# Patient Record
Sex: Male | Born: 1950 | Race: White | Hispanic: No | Marital: Married | Smoking: Never smoker
Health system: Southern US, Community
[De-identification: ages and names within clinical notes are randomized; demographics above are authoritative.]

## PROBLEM LIST (undated history)

## (undated) DIAGNOSIS — K219 Gastro-esophageal reflux disease without esophagitis: Secondary | ICD-10-CM

## (undated) DIAGNOSIS — I2510 Atherosclerotic heart disease of native coronary artery without angina pectoris: Secondary | ICD-10-CM

## (undated) DIAGNOSIS — I219 Acute myocardial infarction, unspecified: Secondary | ICD-10-CM

## (undated) DIAGNOSIS — Z8719 Personal history of other diseases of the digestive system: Secondary | ICD-10-CM

## (undated) DIAGNOSIS — I10 Essential (primary) hypertension: Secondary | ICD-10-CM

## (undated) DIAGNOSIS — M1990 Unspecified osteoarthritis, unspecified site: Secondary | ICD-10-CM

## (undated) HISTORY — DX: Gastro-esophageal reflux disease without esophagitis: K21.9

## (undated) HISTORY — DX: Atherosclerotic heart disease of native coronary artery without angina pectoris: I25.10

## (undated) HISTORY — PX: TIBIA FRACTURE SURGERY: SHX806

## (undated) HISTORY — PX: TONSILLECTOMY: SUR1361

---

## 2008-09-11 DIAGNOSIS — I219 Acute myocardial infarction, unspecified: Secondary | ICD-10-CM

## 2008-09-11 HISTORY — PX: CORONARY ARTERY BYPASS GRAFT: SHX141

## 2008-09-11 HISTORY — DX: Acute myocardial infarction, unspecified: I21.9

## 2009-04-15 ENCOUNTER — Encounter (HOSPITAL_COMMUNITY): Admission: RE | Admit: 2009-04-15 | Discharge: 2009-06-10 | Payer: Self-pay | Admitting: Cardiovascular Disease

## 2010-01-15 ENCOUNTER — Emergency Department (HOSPITAL_COMMUNITY): Admission: EM | Admit: 2010-01-15 | Discharge: 2010-01-16 | Payer: Self-pay | Admitting: Emergency Medicine

## 2010-09-06 ENCOUNTER — Ambulatory Visit: Payer: Self-pay | Admitting: Cardiovascular Disease

## 2010-09-14 ENCOUNTER — Ambulatory Visit: Payer: Self-pay | Admitting: Cardiovascular Disease

## 2010-11-29 LAB — CBC
HCT: 44.9 % (ref 39.0–52.0)
Hemoglobin: 15.6 g/dL (ref 13.0–17.0)
MCHC: 34.6 g/dL (ref 30.0–36.0)
Platelets: 218 10*3/uL (ref 150–400)
RDW: 13.4 % (ref 11.5–15.5)

## 2010-11-29 LAB — LIPASE, BLOOD: Lipase: 45 U/L (ref 11–59)

## 2010-11-29 LAB — DIFFERENTIAL
Eosinophils Absolute: 0.3 10*3/uL (ref 0.0–0.7)
Eosinophils Relative: 3 % (ref 0–5)
Lymphocytes Relative: 28 % (ref 12–46)
Neutrophils Relative %: 58 % (ref 43–77)

## 2010-11-29 LAB — POCT CARDIAC MARKERS
CKMB, poc: <1 ng/mL — ABNORMAL LOW (ref 1.0–8.0)
CKMB, poc: <1 ng/mL — ABNORMAL LOW (ref 1.0–8.0)
Myoglobin, poc: 73 ng/mL (ref 12–200)
Myoglobin, poc: 76.5 ng/mL (ref 12–200)
Troponin i, poc: <0.05 ng/mL (ref 0.00–0.09)

## 2010-11-29 LAB — POCT I-STAT, CHEM 8
BUN: 23 mg/dL (ref 6–23)
HCT: 47 % (ref 39.0–52.0)

## 2010-12-20 ENCOUNTER — Telehealth: Payer: Self-pay | Admitting: Cardiovascular Disease

## 2010-12-20 ENCOUNTER — Other Ambulatory Visit (INDEPENDENT_AMBULATORY_CARE_PROVIDER_SITE_OTHER): Payer: BC Managed Care – PPO | Admitting: *Deleted

## 2010-12-20 DIAGNOSIS — E7800 Pure hypercholesterolemia, unspecified: Secondary | ICD-10-CM

## 2010-12-20 DIAGNOSIS — E785 Hyperlipidemia, unspecified: Secondary | ICD-10-CM

## 2010-12-20 LAB — HEPATIC FUNCTION PANEL
AST: 22 U/L (ref 0–37)
Bilirubin, Direct: 0.1 mg/dL (ref 0.0–0.3)
Total Bilirubin: 0.7 mg/dL (ref 0.3–1.2)

## 2010-12-20 LAB — LIPID PANEL
LDL Cholesterol: 52 mg/dL (ref 0–99)
Total CHOL/HDL Ratio: 3
Triglycerides: 78 mg/dL (ref 0.0–149.0)
VLDL: 15.6 mg/dL (ref 0.0–40.0)

## 2010-12-20 LAB — BASIC METABOLIC PANEL
Creatinine, Ser: 1.1 mg/dL (ref 0.4–1.5)
GFR: 75.61 mL/min (ref >60.00)
Potassium: 4.3 mEq/L (ref 3.5–5.1)
Sodium: 141 mEq/L (ref 135–145)

## 2010-12-20 NOTE — Telephone Encounter (Signed)
Walk in for fasting labs, order placed Alfonso Ramus RN

## 2010-12-20 NOTE — Telephone Encounter (Signed)
Patient called with lab results. Jodette Deakon Frix RN  

## 2010-12-21 ENCOUNTER — Telehealth: Payer: Self-pay | Admitting: Cardiovascular Disease

## 2010-12-21 NOTE — Telephone Encounter (Signed)
PT SAID SOMEONE LEFT LAB RESULTS BUT HE WANTS A CALL BACK FOR A NURSE TO GO OVER THOSE #'S WITH HIM. CHART IN BOX.

## 2010-12-21 NOTE — Telephone Encounter (Signed)
Results of cholesterol given, he wants to know if med needs adjusting or what he can do to increase his HDL of 34. Please advise. Alfonso Ramus RN

## 2010-12-22 ENCOUNTER — Telehealth: Payer: Self-pay | Admitting: Cardiovascular Disease

## 2010-12-22 ENCOUNTER — Other Ambulatory Visit: Payer: Self-pay | Admitting: *Deleted

## 2010-12-22 ENCOUNTER — Other Ambulatory Visit: Payer: Self-pay | Admitting: Cardiovascular Disease

## 2010-12-22 ENCOUNTER — Telehealth: Payer: Self-pay | Admitting: *Deleted

## 2010-12-22 DIAGNOSIS — E785 Hyperlipidemia, unspecified: Secondary | ICD-10-CM

## 2010-12-22 MED ORDER — NIACIN ER (ANTIHYPERLIPIDEMIC) 1000 MG PO TBCR: 1000.0000 mg | EXTENDED_RELEASE_TABLET | Freq: Every day | ORAL | Status: DC

## 2010-12-22 NOTE — Telephone Encounter (Signed)
Add niaspan 500 mg qhs.

## 2010-12-22 NOTE — Telephone Encounter (Signed)
Pt concerned with niaspan being increased to 1000mg . [place] Spaniel he has some joint aches and wonders if its from that drug, explained to him it is not a usual side effect for niaspan but i would ask you if he should increase it or not. Please advise. Darrell Ramus RN

## 2010-12-22 NOTE — Telephone Encounter (Signed)
msg left to increase med, refill with increased dose [facility].  Mailed results with request of lab app in 3 months to  Pt.Alfonso Ramus RN

## 2010-12-22 NOTE — Telephone Encounter (Signed)
Pt phoned msg left for an increase to niaspan to 1000mg  and to increase exercise. Office number left for any questions or concerns.Alfonso Ramus RN

## 2010-12-23 ENCOUNTER — Telehealth: Payer: Self-pay | Admitting: *Deleted

## 2010-12-23 NOTE — Telephone Encounter (Signed)
Niaspan will not cause muscle aches.

## 2010-12-23 NOTE — Telephone Encounter (Signed)
Pt called with the ok to increase niaspan, msg left.Alfonso Ramus RN

## 2010-12-26 ENCOUNTER — Other Ambulatory Visit: Payer: Self-pay | Admitting: *Deleted

## 2010-12-26 DIAGNOSIS — E785 Hyperlipidemia, unspecified: Secondary | ICD-10-CM

## 2010-12-26 MED ORDER — NIACIN ER (ANTIHYPERLIPIDEMIC) 1000 MG PO TBCR: 1000.0000 mg | EXTENDED_RELEASE_TABLET | Freq: Every day | ORAL | Status: DC

## 2011-01-04 ENCOUNTER — Telehealth: Payer: Self-pay | Admitting: *Deleted

## 2011-01-04 NOTE — Telephone Encounter (Signed)
Clarified fax from pharmacy, error send on there part.Alfonso Ramus RN

## 2011-03-22 ENCOUNTER — Other Ambulatory Visit: Payer: Self-pay | Admitting: *Deleted

## 2011-03-22 ENCOUNTER — Telehealth: Payer: Self-pay | Admitting: Cardiovascular Disease

## 2011-03-22 DIAGNOSIS — E785 Hyperlipidemia, unspecified: Secondary | ICD-10-CM

## 2011-03-22 NOTE — Telephone Encounter (Signed)
Pt told to call back and set lab app and if he wants to see dr Ian Bushman we can get him an app. Told to call back.

## 2011-03-22 NOTE — Telephone Encounter (Signed)
Patient called today wanting to schedule his appointment from his recall letter but there were no labs listed for him. Despite the advice given from you he really insists that he wants to have some blood work done a day ahead of schedule so he can go over the information with the doctor. Also says that he only has the summer months to come in anytime and any other time that Dr. Elease Hashimoto has available (starting on Aug 29th) does not work for him. Please call back. I have pulled his chart.

## 2011-03-23 NOTE — Telephone Encounter (Signed)
App set.

## 2011-03-30 ENCOUNTER — Other Ambulatory Visit (INDEPENDENT_AMBULATORY_CARE_PROVIDER_SITE_OTHER): Payer: BC Managed Care – PPO | Admitting: *Deleted

## 2011-03-30 DIAGNOSIS — E785 Hyperlipidemia, unspecified: Secondary | ICD-10-CM

## 2011-03-30 LAB — BASIC METABOLIC PANEL
BUN: 19 mg/dL (ref 6–23)
Chloride: 105 mEq/L (ref 96–112)
Creatinine, Ser: 1 mg/dL (ref 0.4–1.5)
Glucose, Bld: 101 mg/dL — ABNORMAL HIGH (ref 70–99)
Sodium: 141 mEq/L (ref 135–145)

## 2011-03-30 LAB — LIPID PANEL
Cholesterol: 116 mg/dL (ref 0–200)
LDL Cholesterol: 56 mg/dL (ref 0–99)
Total CHOL/HDL Ratio: 3
VLDL: 15.2 mg/dL (ref 0.0–40.0)

## 2011-03-30 LAB — HEPATIC FUNCTION PANEL
ALT: 29 U/L (ref 0–53)
Albumin: 4.4 g/dL (ref 3.5–5.2)
Total Bilirubin: 0.6 mg/dL (ref 0.3–1.2)
Total Protein: 7.5 g/dL (ref 6.0–8.3)

## 2011-03-31 ENCOUNTER — Telehealth: Payer: Self-pay | Admitting: *Deleted

## 2011-03-31 NOTE — Telephone Encounter (Signed)
Message copied by Antony Odea on Fri Mar 31, 2011  3:40 PM ------      Message from: Vesta Mixer      Created: Fri Mar 31, 2011 12:02 PM       Normal

## 2011-03-31 NOTE — Telephone Encounter (Signed)
Normal labs given in msg, Alfonso Ramus RN

## 2011-05-16 ENCOUNTER — Other Ambulatory Visit: Payer: Self-pay | Admitting: *Deleted

## 2011-05-16 MED ORDER — METOPROLOL TARTRATE 25 MG PO TABS: 25.0000 mg | ORAL_TABLET | Freq: Two times a day (BID) | ORAL | Status: DC

## 2011-05-16 MED ORDER — ROSUVASTATIN CALCIUM 10 MG PO TABS: 10.0000 mg | ORAL_TABLET | Freq: Every day | ORAL | Status: DC

## 2011-05-16 NOTE — Telephone Encounter (Signed)
Fax received from pharmacy. Refill completed. Jodette Doyce Stonehouse RN  

## 2011-09-01 ENCOUNTER — Other Ambulatory Visit: Payer: BC Managed Care – PPO | Admitting: *Deleted

## 2011-09-04 ENCOUNTER — Ambulatory Visit (INDEPENDENT_AMBULATORY_CARE_PROVIDER_SITE_OTHER): Payer: BC Managed Care – PPO | Admitting: *Deleted

## 2011-09-04 DIAGNOSIS — E7800 Pure hypercholesterolemia, unspecified: Secondary | ICD-10-CM

## 2011-09-04 LAB — HEPATIC FUNCTION PANEL
ALT: 21 U/L (ref 0–53)
AST: 22 U/L (ref 0–37)
Bilirubin, Direct: 0.1 mg/dL (ref 0.0–0.3)

## 2011-09-04 LAB — LIPID PANEL
HDL: 36.9 mg/dL — ABNORMAL LOW (ref >39.00)
Total CHOL/HDL Ratio: 3
Triglycerides: 67 mg/dL (ref 0.0–149.0)
VLDL: 13.4 mg/dL (ref 0.0–40.0)

## 2011-09-07 ENCOUNTER — Ambulatory Visit (INDEPENDENT_AMBULATORY_CARE_PROVIDER_SITE_OTHER): Payer: BC Managed Care – PPO | Admitting: Cardiovascular Disease

## 2011-09-07 ENCOUNTER — Encounter: Payer: Self-pay | Admitting: Cardiovascular Disease

## 2011-09-07 VITALS — BP 111/76 | HR 66 | Ht 70.0 in | Wt 164.8 lb

## 2011-09-07 DIAGNOSIS — I2510 Atherosclerotic heart disease of native coronary artery without angina pectoris: Secondary | ICD-10-CM

## 2011-09-07 DIAGNOSIS — E785 Hyperlipidemia, unspecified: Secondary | ICD-10-CM

## 2011-09-07 NOTE — Progress Notes (Signed)
    Darrell Wagner Date of Birth  [birth year] [facility] [address]    [address] [place], [place]  [address] [phone]  Fax  [phone]  [facility] [address] [place], [place]  [address] [phone]   Fax [phone]  History of Present Illness:  Darrell Wagner is a 60 year old gentleman with a history of coronary artery disease-status post CABG. He also had transient atrial fibrillation. [name] done well since I last saw him. [name] not having episodes of chest pain or shortness of breath.  Current Outpatient Prescriptions on File Prior to Visit  Medication Sig Dispense Refill  . metoprolol tartrate (LOPRESSOR) 25 MG tablet Take 1 tablet (25 mg total) by mouth 2 (two) times daily.  60 tablet  3  . niacin (NIASPAN) 1000 MG CR tablet Take 1 tablet (1,000 mg total) by mouth at bedtime.  90 tablet  3  . rosuvastatin (CRESTOR) 10 MG tablet Take 1 tablet (10 mg total) by mouth at bedtime.  30 tablet  3    Not on File  Past Medical History  Diagnosis Date  . Coronary artery disease     Past Surgical History  Procedure Date  . Coronary artery bypass graft     x4  . Tonsillectomy   . Tibia fracture surgery     History  Smoking status  . Never Smoker   Smokeless tobacco  . Not on file    History  Alcohol Use     Family History  Problem Relation Age of Onset  . Hypertension Mother   . Hypertension Sister     Reviw of Systems:  Reviewed in the HPI.  All other systems are negative.  Physical Exam: BP 111/76  Pulse 66  Ht 5\' 10"  (1.778 m)  Wt 164 lb 12.8 oz (74.753 kg)  BMI 23.65 kg/m2 The patient is alert and oriented x 3.  The mood and affect are normal.   Skin: warm and dry.  Color is normal.    HEENT:   Normocephalic/atraumatic. Normal carotids. His neck is supple.  Lungs: Lungs are clear to auscultation.   Heart: Regular rate S1-S2. No murmurs.    Abdomen: Good bowel sounds. No hepatosplenomegaly.  Extremities:  No clubbing cyanosis  or edema.  Neuro:  Nonfocal. His gait is normal.    ECG: Normal sinus rhythm, normal EKG.  Assessment / Plan:

## 2011-09-07 NOTE — Assessment & Plan Note (Signed)
Darrell Wagner is a history of CAD and is status post CABG in July of 2010. She's done well. We'll continue the same medications.  His lipid levels are well controlled. His HDL is still a bit low. I've encouraged him to exercise on regular basis.

## 2011-09-07 NOTE — Patient Instructions (Signed)
Your physician wants you to follow-up in: 6 months You will receive a reminder letter in the mail two months in advance. If you don't receive a letter, please call our office to schedule the follow-up appointment.  Your physician recommends that you return for a FASTING lipid profile: 6 months   Your physician recommends that you continue on your current medications as directed. Please refer to the Current Medication list given to you today.  

## 2011-09-15 ENCOUNTER — Other Ambulatory Visit: Payer: Self-pay | Admitting: *Deleted

## 2011-09-15 ENCOUNTER — Other Ambulatory Visit: Payer: Self-pay

## 2011-09-15 MED ORDER — ROSUVASTATIN CALCIUM 10 MG PO TABS: 10.0000 mg | ORAL_TABLET | Freq: Every day | ORAL | Status: DC

## 2011-09-15 NOTE — Telephone Encounter (Signed)
Fax Received. Refill Completed. Deverick Pruss Chowoe (R.M.A)   

## 2011-09-19 ENCOUNTER — Other Ambulatory Visit: Payer: Self-pay | Admitting: *Deleted

## 2011-09-19 MED ORDER — METOPROLOL TARTRATE 25 MG PO TABS: 25.0000 mg | ORAL_TABLET | Freq: Two times a day (BID) | ORAL | Status: DC

## 2012-01-15 ENCOUNTER — Other Ambulatory Visit: Payer: Self-pay | Admitting: *Deleted

## 2012-01-15 DIAGNOSIS — E785 Hyperlipidemia, unspecified: Secondary | ICD-10-CM

## 2012-01-15 MED ORDER — NIACIN ER (ANTIHYPERLIPIDEMIC) 1000 MG PO TBCR: 1000.0000 mg | EXTENDED_RELEASE_TABLET | Freq: Every day | ORAL | Status: DC

## 2012-01-22 ENCOUNTER — Other Ambulatory Visit: Payer: Self-pay | Admitting: *Deleted

## 2012-01-22 MED ORDER — METOPROLOL TARTRATE 25 MG PO TABS: 25.0000 mg | ORAL_TABLET | Freq: Two times a day (BID) | ORAL | Status: DC

## 2012-03-19 ENCOUNTER — Other Ambulatory Visit: Payer: Self-pay | Admitting: Cardiovascular Disease

## 2012-03-25 ENCOUNTER — Other Ambulatory Visit (INDEPENDENT_AMBULATORY_CARE_PROVIDER_SITE_OTHER): Payer: BC Managed Care – PPO

## 2012-03-25 DIAGNOSIS — E785 Hyperlipidemia, unspecified: Secondary | ICD-10-CM

## 2012-03-25 LAB — BASIC METABOLIC PANEL
CO2: 27 mEq/L (ref 19–32)
Chloride: 107 mEq/L (ref 96–112)
Glucose, Bld: 89 mg/dL (ref 70–99)
Potassium: 3.9 mEq/L (ref 3.5–5.1)
Sodium: 141 mEq/L (ref 135–145)

## 2012-03-25 LAB — HEPATIC FUNCTION PANEL
ALT: 19 U/L (ref 0–53)
Total Bilirubin: 0.5 mg/dL (ref 0.3–1.2)
Total Protein: 7.7 g/dL (ref 6.0–8.3)

## 2012-03-25 LAB — LIPID PANEL: Total CHOL/HDL Ratio: 3

## 2012-03-27 ENCOUNTER — Ambulatory Visit (INDEPENDENT_AMBULATORY_CARE_PROVIDER_SITE_OTHER): Payer: BC Managed Care – PPO | Admitting: Cardiovascular Disease

## 2012-03-27 ENCOUNTER — Encounter: Payer: Self-pay | Admitting: Cardiovascular Disease

## 2012-03-27 VITALS — BP 124/80 | HR 64 | Ht 70.0 in | Wt 164.8 lb

## 2012-03-27 DIAGNOSIS — I2510 Atherosclerotic heart disease of native coronary artery without angina pectoris: Secondary | ICD-10-CM

## 2012-03-27 NOTE — Patient Instructions (Addendum)
Your physician wants you to follow-up in: 6 months  You will receive a reminder letter in the mail two months in advance. If you don't receive a letter, please call our office to schedule the follow-up appointment.  Your physician recommends that you return for a FASTING lipid profile: 6 months   

## 2012-03-27 NOTE — Progress Notes (Signed)
    Darrell Wagner Date of Birth  06/28/1951       [facility] Office [address]  [address] [place], [place]  [address]   [place], [place]  [address] [phone]     [phone]   Fax  [phone]    Fax [phone]  Problem List: 1. 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 Transient atrial fibrillation 3. Hyperlipidemia  History of Present Illness: Darrell Wagner is a 61 year old gentleman with a history of coronary artery disease-status post CABG. He also had transient atrial fibrillation. [name] done well since I last saw him. [name] not having episodes of chest pain or shortness of breath.  He has done well. [name] done very well since I last saw him. Not had any episodes of chest pain or shortness of breath.  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  He is feeling better now that it's summer.     Current Outpatient Prescriptions on File Prior to Visit  Medication Sig Dispense Refill  . metoprolol tartrate (LOPRESSOR) 25 MG tablet Take 1 tablet (25 mg total) by mouth 2 (two) times daily.  60 tablet  6  . niacin (NIASPAN) 1000 MG CR tablet Take 1 tablet (1,000 mg total) by mouth at bedtime.  90 tablet  1  . Ranitidine HCl (ZANTAC PO) Take by mouth.        . rosuvastatin (CRESTOR) 10 MG tablet Take 1 tablet (10 mg total) by mouth at bedtime.  30 tablet  5  . DISCONTD: CRESTOR 10 MG tablet TAKE 1 TABLET BY MOUTH AT BEDTIME  30 tablet  5    Allergies  Allergen Reactions  . Sulfa Drugs Cross Reactors     Past Medical History  Diagnosis Date  . Coronary artery disease     Past Surgical History  Procedure Date  . Coronary artery bypass graft     x4  . Tonsillectomy   . Tibia fracture surgery     History  Smoking status  . Never Smoker   Smokeless tobacco  . Not on file    History  Alcohol Use     Family History  Problem Relation Age of Onset  . Hypertension Mother   . Hypertension  Sister     Reviw of Systems:  Reviewed in the HPI.  All other systems are negative.  Physical Exam: Blood pressure 124/80, pulse 64, height 5\' 10"  (1.778 m), weight 164 lb 12.8 oz (74.753 kg). General: Well developed, well nourished, in no acute distress.  Head: Normocephalic, atraumatic, sclera non-icteric, mucus membranes are moist,   Neck: Supple. Carotids are 2 + without bruits. No JVD  Lungs: Clear bilaterally to auscultation.  Heart: regular rate.  normal  S1 S2. No murmurs, gallops or rubs.  Abdomen: Soft, non-tender, non-distended with normal bowel sounds. No hepatomegaly. No rebound/guarding. No masses.  Msk:  Strength and tone are normal  Extremities: No clubbing or cyanosis. No edema.  Distal pedal pulses are 2+ and equal bilaterally.  Neuro: Alert and oriented X 3. Moves all extremities spontaneously.  Psych:  Responds to questions appropriately with a normal affect.  ECG:  Assessment / Plan:

## 2012-07-24 ENCOUNTER — Other Ambulatory Visit: Payer: Self-pay | Admitting: *Deleted

## 2012-07-24 DIAGNOSIS — E785 Hyperlipidemia, unspecified: Secondary | ICD-10-CM

## 2012-07-24 MED ORDER — NIACIN ER (ANTIHYPERLIPIDEMIC) 1000 MG PO TBCR: 1000.0000 mg | EXTENDED_RELEASE_TABLET | Freq: Every day | ORAL | Status: DC

## 2012-07-24 NOTE — Telephone Encounter (Signed)
Opened in Error.

## 2012-07-24 NOTE — Telephone Encounter (Signed)
Fax Received. Refill Completed. Darrell Wagner (R.M.A)   

## 2012-08-21 ENCOUNTER — Telehealth: Payer: Self-pay | Admitting: Cardiovascular Disease

## 2012-08-21 DIAGNOSIS — I2510 Atherosclerotic heart disease of native coronary artery without angina pectoris: Secondary | ICD-10-CM

## 2012-08-21 NOTE — Telephone Encounter (Signed)
New problem:   Need lab work prior to appt in Jan.

## 2012-08-21 NOTE — Telephone Encounter (Signed)
LMTCB.  Pt needs lipid/lfts. Order placed in computer Mylo Red RN

## 2012-08-21 NOTE — Telephone Encounter (Signed)
Wife calls today to schedule fasting lab work for pt.  Labs & appointment ordered for 09/12/12. She also asked about pt having a Vitamin D level. I could not order this as it was not in ov recommendations for 03/2012. He does have a pcp but according to wife only sees him prn. Mylo Red RN

## 2012-08-22 ENCOUNTER — Ambulatory Visit (INDEPENDENT_AMBULATORY_CARE_PROVIDER_SITE_OTHER): Payer: BC Managed Care – PPO | Admitting: General Surgery

## 2012-08-22 ENCOUNTER — Encounter (INDEPENDENT_AMBULATORY_CARE_PROVIDER_SITE_OTHER): Payer: Self-pay | Admitting: General Surgery

## 2012-08-22 VITALS — BP 134/80 | HR 72 | Temp 97.4°F | Resp 12 | Ht 70.0 in | Wt 168.0 lb

## 2012-08-22 DIAGNOSIS — K4090 Unilateral inguinal hernia, without obstruction or gangrene, not specified as recurrent: Secondary | ICD-10-CM

## 2012-08-22 NOTE — Progress Notes (Signed)
Patient ID: Darrell Wagner, male   DOB: 1950-10-18, 61 y.o.   MRN: [id number]  Chief Complaint  Patient presents with  . New Evaluation    eval LIH    HPI Darrell Wagner is a 61 y.o. male.   HPI 61 yo WM referred by Dr Gerri Spore for evaluation of Left inguinal pain -possible hernia. The patient states that he started having some problems about one month ago. He states that when he got up from a sitting position he would have some pain in his left groin it would only last a few minutes. It happened several times but eventually went away. Last week, he developed a constant pain in his left groin. He states that it was very tender and very painful. He describes it as a burning stinging sensation. By the time he saw his primary care physician that burning stinging pain had gone away but he could tell that something was still there. He is not having any pain or discomfort currently. He can tell that something is just there. He denies any fever, chills, nausea, vomiting, diarrhea or constipation. He denies any dysuria. He denies any recent strenuous activities. 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 Past Medical History  Diagnosis Date  . Coronary artery disease   . GERD (gastroesophageal reflux disease)     Past Surgical History  Procedure Date  . Coronary artery bypass graft     x4  . Tonsillectomy   . Tibia fracture surgery     Family History  Problem Relation Age of Onset  . Hypertension Mother   . Hypertension Sister     Social History History  Substance Use Topics  . Smoking status: Never Smoker   . Smokeless tobacco: Never Used  . Alcohol Use: No    Allergies  Allergen Reactions  . Sulfa Drugs Cross Reactors     Current Outpatient Prescriptions  Medication Sig Dispense Refill  . metoprolol tartrate (LOPRESSOR) 25 MG tablet Take 1 tablet (25 mg total) by mouth 2 (two)  times daily.  60 tablet  6  . niacin (NIASPAN) 1000 MG CR tablet Take 1 tablet (1,000 mg total) by mouth at bedtime.  30 tablet  2  . Ranitidine HCl (ZANTAC PO) Take by mouth.        . rosuvastatin (CRESTOR) 10 MG tablet Take 1 tablet (10 mg total) by mouth at bedtime.  30 tablet  5    Review of Systems Review of Systems  Constitutional: Negative for fever, chills, appetite change and unexpected weight change.  HENT: Negative for congestion and trouble swallowing.   Eyes: Negative for visual disturbance.  Respiratory: Negative for chest tightness and shortness of breath.   Cardiovascular: Negative for chest pain and leg swelling.       No PND, no orthopnea, no DOE  Gastrointestinal:       See HPI  Genitourinary: Negative for dysuria and hematuria.       Some nocturia and sensation of incomplete emptying  Musculoskeletal: Negative.   Skin: Negative for rash.  Neurological: Negative for seizures and speech difficulty.  Hematological: Does not bruise/bleed easily.  Psychiatric/Behavioral: Negative for behavioral problems and confusion.    Blood pressure 134/80, pulse 72, temperature 97.4 F (36.3 C), temperature source Temporal, resp. rate 12, height 5\' 10"  (1.778 m), weight 168 lb (76.204 kg).  Physical Exam Physical Exam  Vitals reviewed. Constitutional: He is oriented to person, place, and time. He appears well-developed and well-nourished. No distress.  HENT:  Head: Normocephalic and atraumatic.  Right Ear: External ear normal.  Left Ear: External ear normal.  Eyes: Conjunctivae normal are normal.  Neck: Neck supple. No tracheal deviation present. No thyromegaly present.  Cardiovascular: Normal rate, regular rhythm and normal heart sounds.   Pulmonary/Chest: Effort normal and breath sounds normal. No respiratory distress. He has no wheezes.    Abdominal: Soft. Bowel sounds are normal. He exhibits no distension. There is no tenderness. A hernia is present. Hernia confirmed  positive in the left inguinal area. Hernia confirmed negative in the right inguinal area.  Genitourinary: Penis normal.    Right testis shows no mass and no tenderness. Left testis shows no mass and no tenderness.       Reducible LIH; b/l testicles down -?slightly atrophic  Musculoskeletal: He exhibits no edema and no tenderness.  Neurological: He is alert and oriented to person, place, and time.  Skin: Skin is warm and dry. No rash noted. He is not diaphoretic. No erythema.       Some scattered telangiectasias on trunk  Psychiatric: He has a normal mood and affect. His behavior is normal. Judgment and thought content normal.    Data Reviewed Dr. Marya Amsler note  Assessment    Left inguinal hernia    Plan    We discussed the etiology of inguinal hernias. We discussed the signs & symptoms of incarceration & strangulation.  We discussed non-operative and operative management. We discussed both open as well as laparoscopic repairs. He was given education regarding both procedures.   I described the procedure in detail.  The patient was given educational material. We discussed the risks and benefits including but not limited to bleeding, infection, chronic inguinal pain, nerve entrapment, hernia recurrence, mesh complications, hematoma formation, urinary retention, injury to the testicles, numbness in the groin, blood clots, injury to the surrounding structures, and anesthesia risk. We also discussed the typical post operative recovery course, including no heavy lifting for 4-6 weeks. I explained that the likelihood of improvement of their symptoms is good.  He is leaning toward a laparoscopic repair of a left inguinal hernia. However he needs to look at his work schedule. He was instructed to contact our office to schedule surgery at his convenience. He will be placed on perioperative flomax to help decrease risk of postoperative urinary retention.  Mary Sella. Andrey Campanile, MD, FACS General,  Bariatric, & Minimally Invasive Surgery [facility], [place]         [facility] M 08/22/2012, 5:09 PM

## 2012-08-22 NOTE — Patient Instructions (Signed)
Call our office at [phone] to schedule your surgery &/or if you have additional questions.  If you would like to wait until the Spring - recommend calling at least 10 weeks before desired date

## 2012-08-26 ENCOUNTER — Other Ambulatory Visit: Payer: Self-pay | Admitting: *Deleted

## 2012-08-26 MED ORDER — METOPROLOL TARTRATE 25 MG PO TABS: 25.0000 mg | ORAL_TABLET | Freq: Two times a day (BID) | ORAL | Status: DC

## 2012-08-26 NOTE — Telephone Encounter (Signed)
Fax Received. Refill Completed. [name] (R.M.A)   

## 2012-08-30 ENCOUNTER — Other Ambulatory Visit: Payer: Self-pay | Admitting: Cardiovascular Disease

## 2012-08-30 ENCOUNTER — Telehealth: Payer: Self-pay | Admitting: Cardiovascular Disease

## 2012-08-30 MED ORDER — METOPROLOL TARTRATE 25 MG PO TABS: 25.0000 mg | ORAL_TABLET | Freq: Two times a day (BID) | ORAL | Status: DC

## 2012-08-30 NOTE — Telephone Encounter (Signed)
Pt's wife calling to request [facility], said pharmacy has been requesting for 2 weeks, pt now out and would like called in asap pls call [phone]

## 2012-09-10 ENCOUNTER — Other Ambulatory Visit (INDEPENDENT_AMBULATORY_CARE_PROVIDER_SITE_OTHER): Payer: BC Managed Care – PPO

## 2012-09-10 DIAGNOSIS — I2510 Atherosclerotic heart disease of native coronary artery without angina pectoris: Secondary | ICD-10-CM

## 2012-09-10 LAB — LIPID PANEL
HDL: 36.6 mg/dL — ABNORMAL LOW (ref >39.00)
Total CHOL/HDL Ratio: 3
Triglycerides: 79 mg/dL (ref 0.0–149.0)
VLDL: 15.8 mg/dL (ref 0.0–40.0)

## 2012-09-10 LAB — HEPATIC FUNCTION PANEL
Albumin: 3.9 g/dL (ref 3.5–5.2)
Total Bilirubin: 0.6 mg/dL (ref 0.3–1.2)

## 2012-09-12 ENCOUNTER — Other Ambulatory Visit: Payer: BC Managed Care – PPO

## 2012-09-16 ENCOUNTER — Ambulatory Visit: Payer: BC Managed Care – PPO | Admitting: Cardiovascular Disease

## 2012-09-18 ENCOUNTER — Telehealth: Payer: Self-pay | Admitting: Cardiovascular Disease

## 2012-09-18 MED ORDER — ROSUVASTATIN CALCIUM 10 MG PO TABS: 10.0000 mg | ORAL_TABLET | Freq: Every day | ORAL | Status: DC

## 2012-09-18 NOTE — Telephone Encounter (Signed)
Spoke with patient's wife she stated patient needed refill for crestor 10 mg daily.[facility] [address]

## 2012-09-18 NOTE — Telephone Encounter (Signed)
Pt is out of meds and needs this called in today please

## 2012-09-18 NOTE — Telephone Encounter (Signed)
[facility]  [facility] [address]    crestor 10 mg

## 2012-09-19 ENCOUNTER — Other Ambulatory Visit: Payer: Self-pay | Admitting: *Deleted

## 2012-10-10 ENCOUNTER — Ambulatory Visit (INDEPENDENT_AMBULATORY_CARE_PROVIDER_SITE_OTHER): Payer: BC Managed Care – PPO | Admitting: Cardiovascular Disease

## 2012-10-10 ENCOUNTER — Ambulatory Visit: Payer: BC Managed Care – PPO | Admitting: Cardiovascular Disease

## 2012-10-10 ENCOUNTER — Encounter: Payer: Self-pay | Admitting: Cardiovascular Disease

## 2012-10-10 VITALS — BP 110/80 | HR 65 | Ht 70.0 in | Wt 167.0 lb

## 2012-10-10 DIAGNOSIS — I2510 Atherosclerotic heart disease of native coronary artery without angina pectoris: Secondary | ICD-10-CM

## 2012-10-10 NOTE — Patient Instructions (Addendum)
Your physician wants you to follow-up in: 6 months  You will receive a reminder letter in the mail two months in advance. If you don't receive a letter, please call our office to schedule the follow-up appointment.  Your physician recommends that you return for a FASTING lipid profile: 6 months   

## 2012-10-10 NOTE — Assessment & Plan Note (Signed)
Darrell Wagner is doing well. [name] not having any episodes of angina. His HDL is low. It was higher in the summer. He admits to not exercising as much now compared to last summer. I've recommended that he start a regular exercise program.  I see him again in 6 months for an office visit and fasting labs.

## 2012-10-10 NOTE — Progress Notes (Signed)
    Merceda Elks Date of Birth  [birth year]       [facility] Office [address]  [address] [place], [place]  [address]   [place], [place]  [address] [phone]     [phone]   Fax  [phone]    Fax [phone]  Problem List: 1. 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 Transient atrial fibrillation 3. Hyperlipidemia  History of Present Illness: Edson is a 62 year old gentleman with a history of coronary artery disease-status post CABG. He also had transient atrial fibrillation. [name] done well since I last saw him. [name] not having episodes of chest pain or shortness of breath.  He has done well. [name] done very well since I last saw him. Not had any episodes of chest pain or shortness of breath.  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  He is feeling better now that it's summer.    October 10, 2012: Mr. Wixom is doing very well from a cardiac standpoint. [name] not having any episodes of angina. He denies any palpitations.  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   Current Outpatient Prescriptions on File Prior to Visit  Medication Sig Dispense Refill  . metoprolol tartrate (LOPRESSOR) 25 MG tablet Take 25 mg by mouth 2 (two) times daily.       . niacin (NIASPAN) 1000 MG CR tablet Take 1 tablet (1,000 mg total) by mouth at bedtime.  30 tablet  2  . Ranitidine HCl (ZANTAC PO) Take by mouth.        . rosuvastatin (CRESTOR) 10 MG tablet Take 1 tablet (10 mg total) by mouth daily.  30 tablet  6    Allergies  Allergen Reactions  . Sulfa Drugs Cross Reactors     Past Medical History  Diagnosis Date  . Coronary artery disease   . GERD (gastroesophageal reflux disease)     Past Surgical History  Procedure Date  . Coronary artery bypass graft     x4  . Tonsillectomy   . Tibia fracture surgery     History  Smoking status  . Never Smoker   Smokeless tobacco  . Never  Used    History  Alcohol Use No    Family History  Problem Relation Age of Onset  . Hypertension Mother   . Hypertension Sister     Reviw of Systems:  Reviewed in the HPI.  All other systems are negative.  Physical Exam: Blood pressure 110/80, pulse 65, height 5\' 10"  (1.778 m), weight 167 lb (75.751 kg). General: Well developed, well nourished, in no acute distress.  Head: Normocephalic, atraumatic, sclera non-icteric, mucus membranes are moist,   Neck: Supple. Carotids are 2 + without bruits. No JVD  Lungs: Clear bilaterally to auscultation.  Heart: regular rate.  normal  S1 S2. No murmurs, gallops or rubs.  Abdomen: Soft, non-tender, non-distended with normal bowel sounds. No hepatomegaly. No rebound/guarding. No masses.  Msk:  Strength and tone are normal  Extremities: No clubbing or cyanosis. No edema.  Distal pedal pulses are 2+ and equal bilaterally.  Neuro: Alert and oriented X 3. Moves all extremities spontaneously.  Psych:  Responds to questions appropriately with a normal affect.  ECG: October 10, 2012:  Normal sinus rhythm at 65 beats a minute. EKG is normal. Assessment / Plan:

## 2012-10-21 ENCOUNTER — Other Ambulatory Visit: Payer: Self-pay | Admitting: *Deleted

## 2012-10-21 DIAGNOSIS — E785 Hyperlipidemia, unspecified: Secondary | ICD-10-CM

## 2012-10-21 MED ORDER — NIACIN ER (ANTIHYPERLIPIDEMIC) 1000 MG PO TBCR: 1000.0000 mg | EXTENDED_RELEASE_TABLET | Freq: Every day | ORAL | Status: DC

## 2012-10-21 NOTE — Telephone Encounter (Signed)
Opened in Error.

## 2012-10-29 ENCOUNTER — Encounter: Payer: Self-pay | Admitting: Cardiovascular Disease

## 2013-03-11 ENCOUNTER — Other Ambulatory Visit: Payer: BC Managed Care – PPO

## 2013-03-26 ENCOUNTER — Emergency Department (HOSPITAL_COMMUNITY): Payer: BC Managed Care – PPO

## 2013-03-26 ENCOUNTER — Other Ambulatory Visit: Payer: Self-pay

## 2013-03-26 ENCOUNTER — Observation Stay (HOSPITAL_COMMUNITY)
Admission: EM | Admit: 2013-03-26 | Discharge: 2013-03-27 | Disposition: A | Discharge status: Home / Self Care | Payer: BC Managed Care – PPO | Attending: Cardiovascular Disease | Admitting: Cardiovascular Disease

## 2013-03-26 ENCOUNTER — Encounter (HOSPITAL_COMMUNITY): Payer: Self-pay | Admitting: Emergency Medicine

## 2013-03-26 DIAGNOSIS — Z79899 Other long term (current) drug therapy: Secondary | ICD-10-CM | POA: Insufficient documentation

## 2013-03-26 DIAGNOSIS — K219 Gastro-esophageal reflux disease without esophagitis: Secondary | ICD-10-CM | POA: Diagnosis present

## 2013-03-26 DIAGNOSIS — I2510 Atherosclerotic heart disease of native coronary artery without angina pectoris: Secondary | ICD-10-CM | POA: Insufficient documentation

## 2013-03-26 DIAGNOSIS — E785 Hyperlipidemia, unspecified: Secondary | ICD-10-CM | POA: Insufficient documentation

## 2013-03-26 DIAGNOSIS — R072 Precordial pain: Principal | ICD-10-CM | POA: Diagnosis present

## 2013-03-26 DIAGNOSIS — Z951 Presence of aortocoronary bypass graft: Secondary | ICD-10-CM | POA: Insufficient documentation

## 2013-03-26 DIAGNOSIS — R079 Chest pain, unspecified: Secondary | ICD-10-CM

## 2013-03-26 HISTORY — DX: Acute myocardial infarction, unspecified: I21.9

## 2013-03-26 LAB — BASIC METABOLIC PANEL
GFR calc Af Amer: 85 mL/min — ABNORMAL LOW (ref >90)
GFR calc non Af Amer: 73 mL/min — ABNORMAL LOW (ref >90)
Glucose, Bld: 98 mg/dL (ref 70–99)
Potassium: 3.7 mEq/L (ref 3.5–5.1)
Sodium: 137 mEq/L (ref 135–145)

## 2013-03-26 LAB — CBC
Hemoglobin: 14.9 g/dL (ref 13.0–17.0)
MCHC: 34.3 g/dL (ref 30.0–36.0)
RDW: 12 % (ref 11.5–15.5)

## 2013-03-26 LAB — POCT I-STAT TROPONIN I

## 2013-03-26 NOTE — ED Notes (Signed)
Pt in room but xray is here for pt.  Pt sts that cp has decreased now and he just has a fullness.

## 2013-03-26 NOTE — ED Notes (Signed)
Patient states that he has been having chest pain for the last four hours, feeling like a fullness in the chest.  Patient states that he did play golf today  And has been hurting since.

## 2013-03-27 ENCOUNTER — Observation Stay (HOSPITAL_COMMUNITY): Payer: BC Managed Care – PPO

## 2013-03-27 ENCOUNTER — Encounter (HOSPITAL_COMMUNITY): Payer: Self-pay | Admitting: Internal Medicine

## 2013-03-27 DIAGNOSIS — R072 Precordial pain: Secondary | ICD-10-CM | POA: Diagnosis present

## 2013-03-27 DIAGNOSIS — K219 Gastro-esophageal reflux disease without esophagitis: Secondary | ICD-10-CM | POA: Diagnosis present

## 2013-03-27 DIAGNOSIS — R079 Chest pain, unspecified: Secondary | ICD-10-CM

## 2013-03-27 LAB — TROPONIN I
Troponin I: <0.3 ng/mL (ref <0.30)
Troponin I: <0.3 ng/mL (ref <0.30)

## 2013-03-27 MED ORDER — NITROGLYCERIN 0.4 MG SL SUBL
0.4000 mg | SUBLINGUAL_TABLET | SUBLINGUAL | Status: DC | PRN
  Administered 2013-03-27 (×3): 0.4 mg via SUBLINGUAL
  Filled 2013-03-27: qty 75

## 2013-03-27 MED ORDER — ASPIRIN 81 MG PO CHEW
81.0000 mg | CHEWABLE_TABLET | Freq: Every day | ORAL | Status: DC
  Administered 2013-03-27: 81 mg via ORAL
  Filled 2013-03-27: qty 1

## 2013-03-27 MED ORDER — PANTOPRAZOLE SODIUM 40 MG PO TBEC
80.0000 mg | DELAYED_RELEASE_TABLET | Freq: Every day | ORAL | Status: DC
  Administered 2013-03-27: 80 mg via ORAL
  Filled 2013-03-27: qty 2

## 2013-03-27 MED ORDER — GI COCKTAIL ~~LOC~~
30.0000 mL | Freq: Once | ORAL | Status: AC
  Administered 2013-03-27: 30 mL via ORAL
  Filled 2013-03-27: qty 30

## 2013-03-27 MED ORDER — ONDANSETRON HCL 4 MG/2ML IJ SOLN: 4.0000 mg | Freq: Four times a day (QID) | INTRAMUSCULAR | Status: DC | PRN

## 2013-03-27 MED ORDER — ACETAMINOPHEN 325 MG PO TABS: 650.0000 mg | ORAL_TABLET | ORAL | Status: DC | PRN

## 2013-03-27 MED ORDER — TECHNETIUM TC 99M SESTAMIBI GENERIC - CARDIOLITE
30.0000 | Freq: Once | INTRAVENOUS | Status: AC | PRN
  Administered 2013-03-27: 30 via INTRAVENOUS

## 2013-03-27 MED ORDER — ATORVASTATIN CALCIUM 10 MG PO TABS
10.0000 mg | ORAL_TABLET | Freq: Every day | ORAL | Status: DC
  Filled 2013-03-27: qty 1

## 2013-03-27 MED ORDER — PANTOPRAZOLE SODIUM 40 MG PO TBEC: 40.0000 mg | DELAYED_RELEASE_TABLET | Freq: Every day | ORAL | Status: DC

## 2013-03-27 MED ORDER — ASPIRIN 81 MG PO CHEW
162.0000 mg | CHEWABLE_TABLET | Freq: Once | ORAL | Status: AC
  Administered 2013-03-27: 162 mg via ORAL
  Filled 2013-03-27: qty 2

## 2013-03-27 MED ORDER — NIACIN ER (ANTIHYPERLIPIDEMIC) 500 MG PO TBCR
1000.0000 mg | EXTENDED_RELEASE_TABLET | Freq: Every day | ORAL | Status: DC
  Filled 2013-03-27: qty 2

## 2013-03-27 MED ORDER — TECHNETIUM TC 99M SESTAMIBI GENERIC - CARDIOLITE
10.0000 | Freq: Once | INTRAVENOUS | Status: AC | PRN
  Administered 2013-03-27: 10 via INTRAVENOUS

## 2013-03-27 MED ORDER — GI COCKTAIL ~~LOC~~: 30.0000 mL | Freq: Four times a day (QID) | ORAL | Status: DC | PRN

## 2013-03-27 MED ORDER — OMEPRAZOLE MAGNESIUM 20 MG PO TBEC: 40.0000 mg | DELAYED_RELEASE_TABLET | Freq: Every day | ORAL | Status: DC

## 2013-03-27 NOTE — Discharge Summary (Signed)
CARDIOLOGY DISCHARGE SUMMARY   Patient ID: [name] MRN: [id number] DOB/AGE: Jun 06, 1951 62 y.o.  Admit date: 03/26/2013 Discharge date: 03/27/2013  Primary Discharge Diagnosis:   Precordial pain   Secondary Discharge Diagnosis:    GERD (gastroesophageal reflux disease)  Procedures: GXT Cardiolite  Hospital Course: SAMUAL Wagner is a 62 y.o. male with a history of CAD. He has been under some increased emotional stress lately. After eating last p.m., he had onset of chest pressure. When his symptoms did not resolve, he came to the emergency room where he was admitted for further evaluation and treatment.  His cardiac enzymes were negative for MI. He did not have any acute abnormalities on his ECG and his other labs were without significant abnormality. His proton pump inhibitor was changed and the dosage increased. His symptoms resolved.  On 03/27/2013, since his cardiac enzymes were negative, he had an exercise Cardiolite. He exceeded his target heart rate without difficulty on the treadmill and had no chest pain with ambulation. The report is below. Darrell Wagner is ambulating without chest pain or SOB and is considered stable for discharge, to follow up as an outpatient.   Labs:   Lab Results  Component Value Date   WBC 7.8 03/26/2013   HGB 14.9 03/26/2013   HCT 43.4 03/26/2013   MCV 94.1 03/26/2013   PLT 176 03/26/2013     Recent Labs Lab 03/26/13 2218  NA 137  K 3.7  CL 102  CO2 27  BUN 20  CREATININE 1.06  CALCIUM 9.8  GLUCOSE 98    Recent Labs  03/27/13 0039 03/27/13 0845  TROPONINI <0.30 <0.30    Radiology: Dg Chest 2 View 03/26/2013   *RADIOLOGY REPORT*  Clinical Data: Chest pain and shortness of breath.  CHEST - 2 VIEW  Comparison: 01/15/2010  Findings: Two views of the chest demonstrate clear lungs.  Median sternotomy wires are present.  Heart size is within normal limits and the trachea is midline.  IMPRESSION: No acute cardiopulmonary disease.   Original  Report Authenticated By: Richarda Overlie, M.D.   Nm Myocar Multi W/spect W/wall Motion / Ef 03/27/2013   *RADIOLOGY REPORT*  Clinical Data:  MYOCARDIAL IMAGING WITH SPECT (REST AND PHARMACOLOGIC-STRESS) GATED LEFT VENTRICULAR WALL MOTION STUDY LEFT VENTRICULAR EJECTION FRACTION  Technique:  Resting myocardial SPECT imaging was initially performed after intravenous administration of radiopharmaceutical. Myocardial SPECT was subsequently performed after additional radiopharmaceutical injection during pharmacologic-stress supervised by the Cardiology staff.  Quantitative gated imaging was also performed to evaluate left ventricular wall motion, and estimate left ventricular ejection fraction.  Radiopharmaceutical:  Tc-13m Cardiolite at rest and during stress.  Comparison: none  Findings:  Technique: Study is adequate.  Perfusion:  There are no relative decreased counts on stress or rest to suggest reversible ischemia or infarction.  Wall motion:   septal hypokinesia.  Normal contractility.  Left ventricular ejection fraction:  Calculated left ventricular ejection fraction = 67%.  IMPRESSION:  1.  No reversible ischemia or infarction. 2. Septal hypokinesia.  3.  Left ventricular ejection fraction equal67%   Original Report Authenticated By: Genevive Bi, M.D.     FOLLOW UP PLANS AND APPOINTMENTS Allergies  Allergen Reactions  . Sulfa Drugs Cross Reactors Other (See Comments)    unknown     Medication List    STOP taking these medications       omeprazole 20 MG tablet  Commonly known as:  PRILOSEC OTC      TAKE these  medications       aspirin 81 MG tablet  Take 81 mg by mouth daily.     metoprolol tartrate 25 MG tablet  Commonly known as:  LOPRESSOR  Take 25 mg by mouth 2 (two) times daily.     niacin 1000 MG CR tablet  Commonly known as:  NIASPAN  Take 1 tablet (1,000 mg total) by mouth at bedtime.     pantoprazole 40 MG tablet  Commonly known as:  PROTONIX  Take 1 tablet (40  mg total) by mouth daily.     ranitidine 150 MG tablet  Commonly known as:  ZANTAC  Take 150 mg by mouth daily.     rosuvastatin 10 MG tablet  Commonly known as:  CRESTOR  Take 1 tablet (10 mg total) by mouth daily.         Future Appointments Provider Department Dept Phone   04/24/2013 8:00 AM Vesta Mixer, MD [facility] Main Office [place]) [phone]   05/26/2013 4:30 PM Vesta Mixer, MD [facility] Main Office [place]) [phone]     Follow-up Information   Follow up with Elyn Aquas., MD On 04/24/2013. (at 8:00 am)    Contact information:   [address] [place] [address] [phone]       BRING ALL MEDICATIONS WITH YOU TO FOLLOW UP APPOINTMENTS  Time spent with patient to include physician time: 43 min Signed: Theodore Demark, PA-C 03/27/2013, 2:03 PM Co-Sign MD  Attending Note:   The patient was seen and examined.  Agree with assessment and plan as noted above.  Changes made to the above note as needed.  See my note from earlier today  Alvia Grove., MD, [facility] 03/27/2013, 5:55 PM

## 2013-03-27 NOTE — Progress Notes (Signed)
Utilization review completed.  

## 2013-03-27 NOTE — H&P (Signed)
Darrell Wagner is an 62 y.o. male.   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 This evening he had gone to hit balls, came home and ate and really noticed more of a left chest pain/pressure. He actually characterizes two sensations - a chest pressure that is sternal, midline and into his neck that he says is more like his GERD but not completely. He also has a left chest pain that is more of a sharp pulling pain that is worse with movement but without reproducible pain. [name] had GERD for years and actually had significant similar pain/chest pressure at midline and near throat before his CABG. He has no associated SOB, no nausea/vomiting, no diarrhea, no pre-syncope/syncope or lightheadedness. No change in diet. He typically eats around 8 and goes to bed closer to midnight. [name] been following with a GI physician and had a fairly recent unrevealing endoscopy.   Past Medical History  Diagnosis Date  . Coronary artery disease   . GERD (gastroesophageal reflux disease)   . Myocardial infarct     Past Surgical History  Procedure Laterality Date  . Coronary artery bypass graft      x4  . Tonsillectomy    . Tibia fracture surgery      Family History  Problem Relation Age of Onset  . Hypertension Mother   . Hypertension Sister   strong family history of CAD  Social History:  reports that he has never smoked. He has never used smokeless tobacco. He reports that he does not drink alcohol or use illicit drugs.  Allergies:  Allergies  Allergen Reactions  . Sulfa Drugs Cross Reactors Other (See Comments)    unknown      (Not in a hospital admission) Home medications reviewed;  Current Facility-Administered Medications  Medication Dose Route Frequency Provider Last Rate Last Dose  . nitroGLYCERIN (NITROSTAT) SL tablet 0.4 mg  0.4 mg Sublingual Q5 min PRN Derwood Kaplan, MD   0.4 mg at 03/27/13 0310   Current Outpatient Prescriptions  Medication Sig Dispense Refill  . aspirin 81 MG tablet Take 81 mg by mouth daily.      . metoprolol tartrate (LOPRESSOR) 25 MG tablet Take 25 mg by mouth 2 (two) times daily.       . niacin (NIASPAN) 1000 MG CR tablet Take 1 tablet (1,000 mg total) by mouth at bedtime.  90 tablet  2  . omeprazole (PRILOSEC OTC) 20 MG tablet Take 20 mg by mouth daily.      . ranitidine (ZANTAC) 150 MG tablet Take 150 mg by mouth daily.      . rosuvastatin (CRESTOR) 10 MG tablet Take 1 tablet (10 mg total) by mouth daily.  30 tablet  6    Results for orders placed during the hospital encounter of 03/26/13 (from the past 48 hour(s))  CBC     Status: None   Collection Time    03/26/13 10:18 PM      Result Value Range   WBC 7.8  4.0 - 10.5 K/uL   RBC 4.61  4.22 - 5.81 MIL/uL   Hemoglobin 14.9  13.0 - 17.0 g/dL   HCT 16.1  09.6 - 04.5 %   MCV 94.1  78.0 - 100.0 fL   MCH 32.3  26.0 - 34.0 pg   MCHC 34.3  30.0 - 36.0 g/dL   RDW 40.9  81.1 - 91.4 %   Platelets 176  150 - 400 K/uL  BASIC METABOLIC PANEL     Status: Abnormal   Collection Time    03/26/13 10:18 PM      Result Value Range   Sodium 137  135 - 145 mEq/L   Potassium 3.7  3.5 - 5.1 mEq/L   Chloride 102  96 - 112 mEq/L   CO2 27  19 - 32 mEq/L   Glucose, Bld 98  70 - 99 mg/dL   BUN 20  6 - 23 mg/dL   Creatinine, Ser 7.82  0.50 - 1.35 mg/dL   Calcium 9.8  8.4 - 95.6 mg/dL   GFR calc non Af Amer 73 (*) >90 mL/min   GFR calc Af Amer 85 (*) >90 mL/min   Comment:            The eGFR has been calculated     using the CKD EPI equation.     This calculation has not been     validated in all clinical     situations.      eGFR's persistently     <90 mL/min signify     possible Chronic Kidney Disease.  POCT I-STAT TROPONIN I     Status: None   Collection Time    03/26/13 10:24 PM      Result Value Range   Troponin i, poc 0.00  0.00 - 0.08 ng/mL   Comment 3            Comment: Due to the release kinetics of cTnI,     a negative result within the first hours     of the onset of symptoms does not rule out     myocardial infarction with certainty.     If myocardial infarction is still suspected,     repeat the test at appropriate intervals.  TROPONIN I     Status: None   Collection Time    03/27/13 12:39 AM      Result Value Range   Troponin I <0.30  <0.30 ng/mL   Comment:            Due to the release kinetics of cTnI,     a negative result within the first hours     of the onset of symptoms does not rule out     myocardial infarction with certainty.     If myocardial infarction is still suspected,     repeat the test at appropriate intervals.   Dg Chest 2 View  03/26/2013   *RADIOLOGY REPORT*  Clinical Data: Chest pain and shortness of breath.  CHEST - 2 VIEW  Comparison: 01/15/2010  Findings: Two views of the chest demonstrate clear lungs.  Median sternotomy wires are present.  Heart size is within normal limits and the trachea is midline.  IMPRESSION: No acute cardiopulmonary disease.   Original Report Authenticated By: Richarda Overlie, M.D.    Review of Systems  Constitutional: Negative for fever and chills.  HENT: Negative for hearing loss, neck pain and tinnitus.   Eyes: Negative for double vision, photophobia and pain.  Respiratory: Negative for cough, hemoptysis, sputum production and shortness of breath.   Cardiovascular: Positive for chest pain.  Negative for palpitations, orthopnea, leg swelling and PND.  Gastrointestinal: Positive for heartburn. Negative for nausea and vomiting.  Musculoskeletal: Negative for myalgias and back pain.  Skin: Negative for itching and rash.  Neurological:  Negative for dizziness, tingling, tremors and headaches.  Endo/Heme/Allergies: Negative for environmental allergies. Does not bruise/bleed easily.  Psychiatric/Behavioral: Negative for depression, suicidal ideas and substance abuse.    Blood pressure 102/65, pulse 80, temperature 98.3 F (36.8 C), temperature source Oral, resp. rate 18, height 5\' 10"  (1.778 m), weight 73.483 kg (162 lb), SpO2 99.00%. Physical Exam  Nursing note and vitals reviewed. Constitutional: He is oriented to person, place, and time. He appears well-developed and well-nourished. No distress.  HENT:  Head: Normocephalic and atraumatic.  Nose: Nose normal.  Mouth/Throat: Oropharynx is clear and moist. No oropharyngeal exudate.  Eyes: Conjunctivae and EOM are normal. Pupils are equal, round, and reactive to light. No scleral icterus.  Neck: Normal range of motion. Neck supple. No JVD present. No tracheal deviation present. No thyromegaly present.  Cardiovascular: Normal rate, regular rhythm, normal heart sounds and intact distal pulses.  Exam reveals no gallop.   No murmur heard. Respiratory: Effort normal and breath sounds normal. No respiratory distress. He has no wheezes. He has no rales.  GI: Soft. Bowel sounds are normal. He exhibits no distension. There is no tenderness. There is no rebound.  Musculoskeletal: Normal range of motion. He exhibits no edema and no tenderness.  Neurological: He is alert and oriented to person, place, and time. He has normal reflexes. No cranial nerve deficit. Coordination normal.  Skin: Skin is warm and dry. No rash noted. He is not diaphoretic. No erythema.  Psychiatric: He has a normal mood and affect. His behavior is normal. Thought content normal.   labs reviewed; na 137, K 3.7, bun/r 20/1, wbc 7.8, plt 176, h/h 15/43 ECG: early transition; NSR Troponin <0.3 5/11 Myoview reviewed  Problem List Chest Pain GERD CAD with CABG 2010 Dyslipidemia Left inguinal  hernia  Assessment/Plan 62 yo man with PMH of GERD, never smoker, strong family history of CAD, CAD and CABG 2010 who started playing golf again 2 weeks ago now hitting golf balls at the range (80-90) every other day who began having chest pressure after golfing and eating this eating around 20:00. Differential diagnosis is certainly musculoskeletal pain from new activity, GERD, esophageal spasm but this could be CAD with progressive native coronary disease or vein graft atherosclerosis. Obtain repeat troponin, observation status with telemetry and likely stress test (exercise nuclear?). I discussed the differential diagnosis with Mr. Heisler at length with his spouse who is going home to get some rest before returning.  - received aspirin, continue daily - NPO for likely stress test this morning - continue PPI, GI prophylaxis - holding metoprolol for possible stress test - troponin x 2 written for - continue PPI, statin  Chritopher Coster 03/27/2013, 5:20 AM

## 2013-03-27 NOTE — ED Provider Notes (Signed)
History    CSN: [id number] Arrival date & time 03/26/13  2201  First MD Initiated Contact with Patient 03/26/13 2303     Chief Complaint  Patient presents with  . Chest Pain   (Consider location/radiation/quality/duration/timing/severity/associated sxs/prior Treatment) HPI Comments: 62 y/o male comes in with cc of chest pain. Pt has hx of CAD. States that his sx, started around 8 pm, after he was done playing golf. He has vague discomfort to the midsternal chest area, and left side, some symptoms similar to indigestion. He took regular OTC meds, and had no improvement in his sx, and since he had a silent MI last time, he decided to come to the ER. He has some nausea, no diaphoresis, no dib. Pt states that he might have over exerted while playing Golf, but had no chest discomfort while playing golf.  Patient is a 62 y.o. male presenting with chest pain. The history is provided by the patient.  Chest Pain Associated symptoms: nausea   Associated symptoms: no cough, no dizziness, no fever, no headache and no shortness of breath    Past Medical History  Diagnosis Date  . Coronary artery disease   . GERD (gastroesophageal reflux disease)   . Myocardial infarct    Past Surgical History  Procedure Laterality Date  . Coronary artery bypass graft      x4  . Tonsillectomy    . Tibia fracture surgery     Family History  Problem Relation Age of Onset  . Hypertension Mother   . Hypertension Sister    History  Substance Use Topics  . Smoking status: Never Smoker   . Smokeless tobacco: Never Used  . Alcohol Use: No    Review of Systems  Constitutional: Negative for fever, chills and activity change.  HENT: Negative for neck pain.   Eyes: Negative for visual disturbance.  Respiratory: Negative for cough, chest tightness and shortness of breath.   Cardiovascular: Positive for chest pain.  Gastrointestinal: Positive for nausea. Negative for abdominal distention.  Genitourinary:  Negative for dysuria, enuresis and difficulty urinating.  Musculoskeletal: Negative for arthralgias.  Neurological: Negative for dizziness, light-headedness and headaches.  Psychiatric/Behavioral: Negative for confusion.    Allergies  Sulfa drugs cross reactors  Home Medications   Current Outpatient Rx  Name  Route  Sig  Dispense  Refill  . aspirin 81 MG tablet   Oral   Take 81 mg by mouth daily.         . metoprolol tartrate (LOPRESSOR) 25 MG tablet   Oral   Take 25 mg by mouth 2 (two) times daily.          . niacin (NIASPAN) 1000 MG CR tablet   Oral   Take 1 tablet (1,000 mg total) by mouth at bedtime.   90 tablet   2   . omeprazole (PRILOSEC OTC) 20 MG tablet   Oral   Take 20 mg by mouth daily.         . ranitidine (ZANTAC) 150 MG tablet   Oral   Take 150 mg by mouth daily.         . rosuvastatin (CRESTOR) 10 MG tablet   Oral   Take 1 tablet (10 mg total) by mouth daily.   30 tablet   6    BP 120/74  Pulse 90  Temp(Src) 98.3 F (36.8 C) (Oral)  Resp 18  Ht 5\' 10"  (1.778 m)  Wt 162 lb (73.483 kg)  BMI 23.24 kg/m2  SpO2 99% Physical Exam  Nursing note and vitals reviewed. Constitutional: He is oriented to person, place, and time. He appears well-developed.  HENT:  Head: Normocephalic and atraumatic.  Eyes: Conjunctivae and EOM are normal. Pupils are equal, round, and reactive to light.  Neck: Normal range of motion. Neck supple.  Cardiovascular: Normal rate, regular rhythm and intact distal pulses.   Pulmonary/Chest: Effort normal and breath sounds normal.  Abdominal: Soft. Bowel sounds are normal. He exhibits no distension. There is no tenderness. There is no rebound and no guarding.  Musculoskeletal: He exhibits no edema.  Neurological: He is alert and oriented to person, place, and time.  Skin: Skin is warm.    ED Course  Procedures (including critical care time) Labs Reviewed  BASIC METABOLIC PANEL - Abnormal; Notable for the  following:    GFR calc non Af Amer 73 (*)    GFR calc Af Amer 85 (*)    All other components within normal limits  CBC  TROPONIN I  POCT I-STAT TROPONIN I   Dg Chest 2 View  03/26/2013   *RADIOLOGY REPORT*  Clinical Data: Chest pain and shortness of breath.  CHEST - 2 VIEW  Comparison: 01/15/2010  Findings: Two views of the chest demonstrate clear lungs.  Median sternotomy wires are present.  Heart size is within normal limits and the trachea is midline.  IMPRESSION: No acute cardiopulmonary disease.   Original Report Authenticated By: Richarda Overlie, M.D.   1. Chest pain     MDM   Date: 03/27/2013  Rate: 78  Rhythm: normal sinus rhythm  QRS Axis: normal  Intervals: normal  ST/T Wave abnormalities: normal  Conduction Disutrbances: none  Narrative Interpretation: unremarkable    Date: 03/27/2013  Rate: 68  Rhythm: normal sinus rhythm  QRS Axis: normal  Intervals: normal  ST/T Wave abnormalities: normal  Conduction Disutrbances: none  Narrative Interpretation: unremarkable  Differential diagnosis includes: ACS syndrome CHF exacerbation Valvular disorder Myocarditis Pericarditis Pericardial effusion Pneumonia Pleural effusion Pulmonary edema PE Anemia Musculoskeletal pain Esophageal Spasms  Pt comes in with cc of chest discomfort. Has hx of CAD, s/p CABG 4 years ago, when he had a silent MI (or was presenting as indigestion that was ignored by him).  Pt is having atypical chest pain again. I got serial trops and serial ekgs, unchanged. Pt still has the discomfort, and i really dont think this is GERD going on for 7 hours. Will get Cardiology evaluation.      Derwood Kaplan, MD 03/27/13 [phone]

## 2013-03-27 NOTE — ED Notes (Signed)
Cardiology here to see pt.

## 2013-03-27 NOTE — ED Notes (Signed)
Pt c/o Nausea, and bloating

## 2013-03-27 NOTE — Progress Notes (Signed)
GXT Cardiolite performed.

## 2013-03-27 NOTE — Progress Notes (Signed)
PROGRESS NOTE  Subjective:   Darrell Wagner is a 62 yo with CAD, CABG, admitted with chest soreness after hitting lots of golf balls this past week.  He also has some indigestion sensation  Associated with this discomfort.  Cardiac enzymes are negative so far.     Objective:    Vital Signs:   Temp:  [98.3 F (36.8 C)-98.4 F (36.9 C)] 98.4 F (36.9 C) (07/17 0645) Pulse Rate:  [60-93] 72 (07/17 0645) Resp:  [11-18] 18 (07/17 0645) BP: (92-138)/(61-79) 124/75 mmHg (07/17 0645) SpO2:  [97 %-100 %] 100 % (07/17 0645) Weight:  [162 lb (73.483 kg)] 162 lb (73.483 kg) (07/17 0645)  Last BM Date: 03/26/13   24-hour weight change: Weight change:   Weight trends: Filed Weights   03/26/13 2207 03/27/13 0645  Weight: 162 lb (73.483 kg) 162 lb (73.483 kg)    Intake/Output:        Physical Exam: BP 124/75  Pulse 72  Temp(Src) 98.4 F (36.9 C) (Oral)  Resp 18  Ht 5\' 10"  (1.778 m)  Wt 162 lb (73.483 kg)  BMI 23.24 kg/m2  SpO2 100%  General: Vital signs reviewed and noted.   Head: Normocephalic, atraumatic.  Eyes: conjunctivae/corneas clear.  EOM's intact.   Throat: normal  Neck:  normal  Lungs:    clear  Heart:  RR, normal S1, S2  Abdomen:  Soft, non-tender, non-distended    Extremities: No edema   Neurologic: A&O X3, CN II - XII are grossly intact.   Psych: Normal     Labs: BMET:  Recent Labs  03/26/13 2218  NA 137  K 3.7  CL 102  CO2 27  GLUCOSE 98  BUN 20  CREATININE 1.06  CALCIUM 9.8    Liver function tests: No results found for this basename: AST, ALT, ALKPHOS, BILITOT, PROT, ALBUMIN,  in the last 72 hours No results found for this basename: LIPASE, AMYLASE,  in the last 72 hours  CBC:  Recent Labs  03/26/13 2218  WBC 7.8  HGB 14.9  HCT 43.4  MCV 94.1  PLT 176    Cardiac Enzymes:  Recent Labs  03/27/13 0039  TROPONINI <0.30    Coagulation Studies: No results found for this basename: LABPROT, INR,  in the last 72  hours  Other: No components found with this basename: POCBNP,  No results found for this basename: DDIMER,  in the last 72 hours No results found for this basename: HGBA1C,  in the last 72 hours No results found for this basename: CHOL, HDL, LDLCALC, TRIG, CHOLHDL,  in the last 72 hours No results found for this basename: TSH, T4TOTAL, FREET3, T3FREE, THYROIDAB,  in the last 72 hours No results found for this basename: VITAMINB12, FOLATE, FERRITIN, TIBC, IRON, RETICCTPCT,  in the last 72 hours   Other results:  Tele:  NSR  Medications:    Infusions:    Scheduled Medications: . aspirin  81 mg Oral Daily  . atorvastatin  10 mg Oral q1800  . niacin  1,000 mg Oral QHS  . pantoprazole  80 mg Oral Daily    Assessment/ Plan:    1. Chest discomfort.  Likely due to MSK.  He does have a hx of cad and the CP is also associated with some indigestion so a myoview is a reasonable choice.  He would like to make it to his wife's doctors apt. Today at 3 PM.  Will do all that we can to have him discharged  by that point ( assuming myoview is negative)   Disposition: DC to home today if myoview is negative.  Length of Stay: 1  Darrell Wagner, Darrell Wagner., MD, [facility] 03/27/2013, 8:29 AM Office [phone] Pager [phone]

## 2013-03-27 NOTE — Progress Notes (Signed)
D/c orders received;IV removed with gauze on, pt remains in stable condition, pt meds and instructions reviewed and given to pt; pt d/c to home 

## 2013-04-06 ENCOUNTER — Other Ambulatory Visit: Payer: Self-pay | Admitting: Cardiovascular Disease

## 2013-04-16 ENCOUNTER — Other Ambulatory Visit: Payer: Self-pay | Admitting: Cardiovascular Disease

## 2013-04-16 MED ORDER — ROSUVASTATIN CALCIUM 10 MG PO TABS: 10.0000 mg | ORAL_TABLET | Freq: Every day | ORAL | Status: DC

## 2013-04-17 ENCOUNTER — Telehealth: Payer: Self-pay | Admitting: Cardiovascular Disease

## 2013-04-17 DIAGNOSIS — I2510 Atherosclerotic heart disease of native coronary artery without angina pectoris: Secondary | ICD-10-CM

## 2013-04-17 DIAGNOSIS — E785 Hyperlipidemia, unspecified: Secondary | ICD-10-CM

## 2013-04-17 NOTE — Telephone Encounter (Signed)
Pt called to have labs order prior office visit. On the last office visit MD recommends for pt  to have fasting lipids in 6 months. Order placed in EPIC for Lipids and Liver panel. Pt is aware to come for labs tomorrow  04/18/13.

## 2013-04-17 NOTE — Telephone Encounter (Signed)
New prob  Pt states he is supposed to have lab work done but there is no order for any. Pt was scheduled to see Dr Melburn Popper in Sept and have fasting labs but he cancelled. He wants to have the labs done before his appt next week.

## 2013-04-18 ENCOUNTER — Other Ambulatory Visit (INDEPENDENT_AMBULATORY_CARE_PROVIDER_SITE_OTHER): Payer: BC Managed Care – PPO

## 2013-04-18 DIAGNOSIS — I2510 Atherosclerotic heart disease of native coronary artery without angina pectoris: Secondary | ICD-10-CM

## 2013-04-18 DIAGNOSIS — E785 Hyperlipidemia, unspecified: Secondary | ICD-10-CM

## 2013-04-18 LAB — BASIC METABOLIC PANEL
Calcium: 9.4 mg/dL (ref 8.4–10.5)
Creatinine, Ser: 1.1 mg/dL (ref 0.4–1.5)
GFR: 69.7 mL/min (ref >60.00)
Glucose, Bld: 95 mg/dL (ref 70–99)
Sodium: 141 mEq/L (ref 135–145)

## 2013-04-18 LAB — HEPATIC FUNCTION PANEL
ALT: 17 U/L (ref 0–53)
AST: 21 U/L (ref 0–37)
Total Bilirubin: 0.6 mg/dL (ref 0.3–1.2)

## 2013-04-18 LAB — LIPID PANEL
HDL: 42.7 mg/dL (ref >39.00)
Triglycerides: 46 mg/dL (ref 0.0–149.0)

## 2013-04-18 NOTE — Telephone Encounter (Signed)
Pt having labs today/ bmet added

## 2013-04-24 ENCOUNTER — Ambulatory Visit (INDEPENDENT_AMBULATORY_CARE_PROVIDER_SITE_OTHER): Payer: BC Managed Care – PPO | Admitting: Cardiovascular Disease

## 2013-04-24 ENCOUNTER — Encounter: Payer: Self-pay | Admitting: Cardiovascular Disease

## 2013-04-24 VITALS — BP 115/74 | HR 67 | Ht 70.0 in | Wt 164.0 lb

## 2013-04-24 DIAGNOSIS — I2510 Atherosclerotic heart disease of native coronary artery without angina pectoris: Secondary | ICD-10-CM

## 2013-04-24 NOTE — Assessment & Plan Note (Signed)
Darrell Wagner is doing well.  His last cholesterol levels are well controlled. Lipid Panel     Component Value Date/Time   CHOL 114 04/18/2013 0940   TRIG 46.0 04/18/2013 0940   HDL 42.70 04/18/2013 0940   CHOLHDL 3 04/18/2013 0940   VLDL 9.2 04/18/2013 0940   LDLCALC 62 04/18/2013 0940   He has some occasional episodes of chest pain/indigestion but they're probably just due to gas pains. He was admitted for observation in mid July. A stress Myoview study was negative for ischemia. His ejection fraction is 67%.  We will continue with his same medications. I'll see him again in 6 months. We'll check fasting labs ( lipid profile, hepatic profile, basic medical profile)  at that time as well as an EKG.

## 2013-04-24 NOTE — Progress Notes (Signed)
Darrell Wagner Date of Birth  Dec 22, 1950       [facility] Office [address], [address]  [address], [address] [place], [place]  [address]   [place], [place]  [address] [phone]     [phone]   Fax  [phone]    Fax [phone]  Problem List: 1. 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 Transient atrial fibrillation 3. Hyperlipidemia  History of Present Illness: Darrell Wagner is a 62 year old gentleman with a history of coronary artery disease-status post CABG. He also had transient atrial fibrillation. [name] done well since I last saw him. [name] not having episodes of chest pain or shortness of breath.  He has done well. [name] done very well since I last saw him. Not had any episodes of chest pain or shortness of breath.  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  He is feeling better now that it's summer.    October 10, 2012: Darrell Wagner is doing very well from a cardiac standpoint. [name] not having any episodes of angina. He denies any palpitations.  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  April 24, 2013:  Darrell Wagner continues to do well. He was admitted to the hospital for chest pain  in mid July. He had a stress Myoview study which was normal. His ejection fraction is 67%.  He has had some intermittant episodes of "gas pain " .  He has noticed improvement with protonix.    Current Outpatient Prescriptions on File Prior to Visit  Medication Sig Dispense Refill  . aspirin 81 MG tablet Take 81 mg by mouth daily.      . metoprolol tartrate (LOPRESSOR) 25 MG tablet Take one tablet by mouth twice daily  180 tablet  5  . niacin (NIASPAN) 1000 MG CR tablet Take 1 tablet (1,000 mg total) by mouth at bedtime.  90 tablet  2  . pantoprazole (PROTONIX) 40 MG tablet Take 1 tablet (40 mg total) by mouth daily.  30 tablet  11  . ranitidine (ZANTAC) 150 MG tablet Take 150 mg by mouth daily.      . rosuvastatin (CRESTOR) 10 MG  tablet Take 1 tablet (10 mg total) by mouth daily.  30 tablet  6   No current facility-administered medications on file prior to visit.    Allergies  Allergen Reactions  . Sulfa Drugs Cross Reactors Other (See Comments)    unknown    Past Medical History  Diagnosis Date  . Coronary artery disease   . GERD (gastroesophageal reflux disease)   . Myocardial infarct     Past Surgical History  Procedure Laterality Date  . Coronary artery bypass graft      x4  . Tonsillectomy    . Tibia fracture surgery      History  Smoking status  . Never Smoker   Smokeless tobacco  . Never Used    History  Alcohol Use No    Family History  Problem Relation Age of Onset  . Hypertension Mother   . Hypertension Sister     Reviw of Systems:  Reviewed in the HPI.  All other systems are negative.  Physical Exam: Blood pressure 115/74, pulse 67, height 5\' 10"  (1.778 m), weight 164 lb (74.39 kg). General: Well developed, well nourished, in no acute distress.  Head: Normocephalic, atraumatic, sclera non-icteric, mucus membranes are moist,   Neck: Supple. Carotids are  2 + without bruits. No JVD  Lungs: Clear bilaterally to auscultation.  Heart: regular rate.  normal  S1 S2. No murmurs, gallops or rubs.  Abdomen: Soft, non-tender, non-distended with normal bowel sounds. No hepatomegaly. No rebound/guarding. No masses.  Msk:  Strength and tone are normal  Extremities: No clubbing or cyanosis. No edema.  Distal pedal pulses are 2+ and equal bilaterally.  Neuro: Alert and oriented X 3. Moves all extremities spontaneously.  Psych:  Responds to questions appropriately with a normal affect.  ECG:  Assessment / Plan:

## 2013-04-24 NOTE — Patient Instructions (Addendum)
Your physician wants you to follow-up in: 6 months/ ekg  You will receive a reminder letter in the mail two months in advance. If you don't receive a letter, please call our office to schedule the follow-up appointment.  Your physician recommends that you return for a FASTING lipid profile: 6 months   Your physician recommends that you continue on your current medications as directed. Please refer to the Current Medication list given to you today.

## 2013-05-26 ENCOUNTER — Ambulatory Visit: Payer: BC Managed Care – PPO | Admitting: Cardiovascular Disease

## 2013-07-17 ENCOUNTER — Other Ambulatory Visit: Payer: Self-pay

## 2013-08-02 ENCOUNTER — Other Ambulatory Visit: Payer: Self-pay | Admitting: Cardiovascular Disease

## 2013-09-01 ENCOUNTER — Ambulatory Visit (INDEPENDENT_AMBULATORY_CARE_PROVIDER_SITE_OTHER): Payer: BC Managed Care – PPO | Admitting: *Deleted

## 2013-09-01 DIAGNOSIS — I2510 Atherosclerotic heart disease of native coronary artery without angina pectoris: Secondary | ICD-10-CM

## 2013-09-01 LAB — HEPATIC FUNCTION PANEL
ALT: 17 U/L (ref 0–53)
AST: 24 U/L (ref 0–37)
Albumin: 4 g/dL (ref 3.5–5.2)
Alkaline Phosphatase: 66 U/L (ref 39–117)
Total Protein: 7.3 g/dL (ref 6.0–8.3)

## 2013-09-01 LAB — BASIC METABOLIC PANEL
Calcium: 8.9 mg/dL (ref 8.4–10.5)
Chloride: 106 mEq/L (ref 96–112)
Creatinine, Ser: 1.2 mg/dL (ref 0.4–1.5)
GFR: 64.95 mL/min (ref >60.00)

## 2013-09-01 LAB — LIPID PANEL
Cholesterol: 99 mg/dL (ref 0–200)
LDL Cholesterol: 52 mg/dL (ref 0–99)
Triglycerides: 55 mg/dL (ref 0.0–149.0)

## 2013-09-02 ENCOUNTER — Encounter: Payer: Self-pay | Admitting: Cardiovascular Disease

## 2013-09-02 ENCOUNTER — Ambulatory Visit (INDEPENDENT_AMBULATORY_CARE_PROVIDER_SITE_OTHER): Payer: BC Managed Care – PPO | Admitting: Cardiovascular Disease

## 2013-09-02 VITALS — BP 127/74 | HR 59 | Ht 70.0 in | Wt 169.1 lb

## 2013-09-02 DIAGNOSIS — I2510 Atherosclerotic heart disease of native coronary artery without angina pectoris: Secondary | ICD-10-CM

## 2013-09-02 DIAGNOSIS — E785 Hyperlipidemia, unspecified: Secondary | ICD-10-CM

## 2013-09-02 NOTE — Patient Instructions (Signed)
Your physician wants you to follow-up in: 6 months  You will receive a reminder letter in the mail two months in advance. If you don't receive a letter, please call our office to schedule the follow-up appointment.   Your physician recommends that you continue on your current medications as directed. Please refer to the Current Medication list given to you today.   Your physician recommends that you return for a FASTING lipid profile: 6 months   

## 2013-09-02 NOTE — Assessment & Plan Note (Signed)
Darrell Wagner is doing well.  His lipids are very good.  He needs to exercise more.  Continue current meds.  I 'll see him in 6 months for OV and lipids, liver, HFP.  [name]

## 2013-09-02 NOTE — Progress Notes (Signed)
Darrell Wagner Date of Birth  08/02/1951       [facility] Office [address]  [address] [place], [place]  [address]   [place], [place]  [address] [phone]     [phone]   Fax  [phone]    Fax [phone]  Problem List: 1. 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 Transient atrial fibrillation 3. Hyperlipidemia  History of Present Illness: Darrell Wagner is a 62 year old gentleman with a history of coronary artery disease-status post CABG. He also had transient atrial fibrillation. [name] done Wagner since I last saw him. [name] not having episodes of chest pain or shortness of breath.  He has done Wagner. [name] done very Wagner since I last saw him. Not had any episodes of chest pain or shortness of breath.  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  He is feeling better now that it's summer.    October 10, 2012: Darrell Wagner is doing very Wagner from a cardiac standpoint. [name] not having any episodes of angina. He denies any palpitations.  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  April 24, 2013:  Darrell Wagner. He was admitted to the hospital for chest pain  in mid July. He had a stress Myoview study which was normal. His ejection fraction is 67%.  He has had some intermittant episodes of "gas pain " .  He has noticed improvement with protonix.    Dec. 23, 2014:  No cardiac issues.  [name] not had any cardiac issues. He is not exercising as much as he thinks he should. He is not having at that any episodes of pain doing his normal activities.  Current Outpatient Prescriptions on File Prior to Visit  Medication Sig Dispense Refill  . aspirin 81 MG tablet Take 81 mg by mouth daily.      . metoprolol tartrate (LOPRESSOR) 25 MG tablet Take one tablet by mouth twice daily  180 tablet  5  . niacin (NIASPAN) 1000 MG CR tablet Take one tablet by mouth at bedtime  90 tablet  1  . pantoprazole  (PROTONIX) 40 MG tablet Take 1 tablet (40 mg total) by mouth daily.  30 tablet  11  . ranitidine (ZANTAC) 150 MG tablet Take 150 mg by mouth daily.      . rosuvastatin (CRESTOR) 10 MG tablet Take 1 tablet (10 mg total) by mouth daily.  30 tablet  6   No current facility-administered medications on file prior to visit.    Allergies  Allergen Reactions  . Sulfa Drugs Cross Reactors Other (See Comments)    unknown    Past Medical History  Diagnosis Date  . Coronary artery disease   . GERD (gastroesophageal reflux disease)   . Myocardial infarct     Past Surgical History  Procedure Laterality Date  . Coronary artery bypass graft      x4  . Tonsillectomy    . Tibia fracture surgery      History  Smoking status  . Never Smoker   Smokeless tobacco  . Never Used    History  Alcohol Use No    Family History  Problem Relation Age of Onset  . Hypertension Mother   . Hypertension Sister     Reviw of Systems:  Reviewed in the HPI.  All other systems are negative.  Physical Exam: Blood pressure 127/74,  pulse 59, height 5\' 10"  (1.778 m), weight 169 lb 1.9 oz (76.712 kg). General: Wagner developed, Wagner nourished, in no acute distress.  Head: Normocephalic, atraumatic, sclera non-icteric, mucus membranes are moist,   Neck: Supple. Carotids are 2 + without bruits. No JVD  Lungs: Clear bilaterally to auscultation.  Heart: regular rate.  normal  S1 S2. No murmurs, gallops or rubs.  Abdomen: Soft, non-tender, non-distended with normal bowel sounds. No hepatomegaly. No rebound/guarding. No masses.  Msk:  Strength and tone are normal  Extremities: No clubbing or cyanosis. No edema.  Distal pedal pulses are 2+ and equal bilaterally.  Neuro: Alert and oriented X 3. Moves all extremities spontaneously.  Psych:  Responds to questions appropriately with a normal affect.  ECG: The super 23, 2014: Sinus bradycardia at 59 beats a minute. He has nonspecific ST and T-wave  adenopathy.  Assessment / Plan:

## 2013-11-16 ENCOUNTER — Other Ambulatory Visit: Payer: Self-pay | Admitting: Cardiovascular Disease

## 2014-01-27 ENCOUNTER — Telehealth: Payer: Self-pay | Admitting: *Deleted

## 2014-01-27 NOTE — Telephone Encounter (Signed)
PA for pantoprazole completed through covermymeds

## 2014-02-05 NOTE — Telephone Encounter (Signed)
[facility] approved pantoprazole dates 12/11/13-02/05/15

## 2014-03-08 ENCOUNTER — Other Ambulatory Visit: Payer: Self-pay | Admitting: Cardiovascular Disease

## 2014-03-10 ENCOUNTER — Other Ambulatory Visit (INDEPENDENT_AMBULATORY_CARE_PROVIDER_SITE_OTHER): Payer: BC Managed Care – PPO

## 2014-03-10 DIAGNOSIS — I2510 Atherosclerotic heart disease of native coronary artery without angina pectoris: Secondary | ICD-10-CM

## 2014-03-10 DIAGNOSIS — E785 Hyperlipidemia, unspecified: Secondary | ICD-10-CM

## 2014-03-10 LAB — LIPID PANEL
CHOL/HDL RATIO: 3
Cholesterol: 122 mg/dL (ref 0–200)
HDL: 46.1 mg/dL (ref >39.00)
LDL CALC: 61 mg/dL (ref 0–99)
NONHDL: 75.9
Triglycerides: 73 mg/dL (ref 0.0–149.0)
VLDL: 14.6 mg/dL (ref 0.0–40.0)

## 2014-03-10 LAB — BASIC METABOLIC PANEL
BUN: 15 mg/dL (ref 6–23)
CHLORIDE: 104 meq/L (ref 96–112)
CO2: 26 meq/L (ref 19–32)
Calcium: 9.2 mg/dL (ref 8.4–10.5)
Creatinine, Ser: 1 mg/dL (ref 0.4–1.5)
GFR: 84.91 mL/min (ref >60.00)
GLUCOSE: 101 mg/dL — AB (ref 70–99)
POTASSIUM: 3.8 meq/L (ref 3.5–5.1)
SODIUM: 139 meq/L (ref 135–145)

## 2014-03-10 LAB — HEPATIC FUNCTION PANEL
ALBUMIN: 4.3 g/dL (ref 3.5–5.2)
ALT: 19 U/L (ref 0–53)
AST: 25 U/L (ref 0–37)
Alkaline Phosphatase: 61 U/L (ref 39–117)
Bilirubin, Direct: 0.1 mg/dL (ref 0.0–0.3)
Total Bilirubin: 0.6 mg/dL (ref 0.2–1.2)
Total Protein: 7.8 g/dL (ref 6.0–8.3)

## 2014-03-12 ENCOUNTER — Other Ambulatory Visit: Payer: BC Managed Care – PPO

## 2014-03-20 ENCOUNTER — Encounter: Payer: Self-pay | Admitting: Cardiovascular Disease

## 2014-03-20 ENCOUNTER — Ambulatory Visit (INDEPENDENT_AMBULATORY_CARE_PROVIDER_SITE_OTHER): Payer: BC Managed Care – PPO | Admitting: Cardiovascular Disease

## 2014-03-20 VITALS — BP 108/80 | HR 66 | Ht 70.0 in | Wt 168.6 lb

## 2014-03-20 DIAGNOSIS — I2510 Atherosclerotic heart disease of native coronary artery without angina pectoris: Secondary | ICD-10-CM

## 2014-03-20 NOTE — Patient Instructions (Signed)
Your physician recommends that you continue on your current medications as directed. Please refer to the Current Medication list given to you today.  Your physician wants you to follow-up in: 12 months with Dr. Elease HashimotoNahser.  You will receive a reminder letter in the mail two months in advance. If you don't receive a letter, please call our office to schedule the follow-up appointment.  Your physician recommends that you return for lab work in: 12 months on the day of or a few days before your office visit with Dr. Elease HashimotoNahser.  You will need to FAST for this appointment - nothing to eat or drink after midnight the night before except water.

## 2014-03-20 NOTE — Progress Notes (Signed)
Darrell Wagner Date of Birth  12/10/1950       [facility]    [place] [facility] [address]  [address] [place], [place]  [id number]   [place], [place]  [id number] [phone]     [phone]   Fax  [phone]    Fax [phone]  Problem List: 1. 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 Transient atrial fibrillation 3. Hyperlipidemia  History of Present Illness: Darrell Wagner is a 63 year old gentleman with a history of coronary artery disease-status post CABG. He also had transient atrial fibrillation. [name] done well since I last saw him. [name] not having episodes of chest pain or shortness of breath.  He has done well. [name] done very well since I last saw him. Not had any episodes of chest pain or shortness of breath.  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  He is feeling better now that it's summer.    October 10, 2012: [name] is doing very well from a cardiac standpoint. [name] not having any episodes of angina. He denies any palpitations.  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  April 24, 2013:  Darrell Wagner continues to do well. He was admitted to the hospital for chest pain  in mid July. He had a stress Myoview study which was normal. His ejection fraction is 67%.  He has had some intermittant episodes of "gas pain " .  He has noticed improvement with protonix.    Dec. 23, 2014:  No cardiac issues.  [name] not had any cardiac issues. He is not exercising as much as he thinks he should. He is not having at that any episodes of pain doing his normal activities.  March 20, 2014:  He has occasional episodes of heartburn. He knows that  eats too late at night.  Exercising some - rides his bike occasionally.     Current Outpatient Prescriptions on File Prior to Visit  Medication Sig Dispense Refill  . aspirin 81 MG tablet Take 81 mg by mouth daily.      . CRESTOR 10 MG tablet TAKE ONE TABLET BY MOUTH  ONE TIME DAILY   30 tablet  3  . metoprolol tartrate (LOPRESSOR) 25 MG tablet Take one tablet by mouth twice daily  180 tablet  5  . niacin (NIASPAN) 1000 MG CR tablet TAKE ONE TABLET BY MOUTH AT BEDTIME   90 tablet  0  . pantoprazole (PROTONIX) 40 MG tablet Take 1 tablet (40 mg total) by mouth daily.  30 tablet  11  . ranitidine (ZANTAC) 150 MG tablet Take 150 mg by mouth daily.       No current facility-administered medications on file prior to visit.    Allergies  Allergen Reactions  . Sulfa Drugs Cross Reactors Other (See Comments)    unknown    Past Medical History  Diagnosis Date  . Coronary artery disease   . GERD (gastroesophageal reflux disease)   . Myocardial infarct     Past Surgical History  Procedure Laterality Date  . Coronary artery bypass graft      x4  . Tonsillectomy    . Tibia fracture surgery      History  Smoking status  . Never Smoker   Smokeless tobacco  . Never Used    History  Alcohol Use No    Family History  Problem Relation Age of Onset  .  Hypertension Mother   . Hypertension Sister     Reviw of Systems:  Reviewed in the HPI.  All other systems are negative.  Physical Exam: Blood pressure 108/80, pulse 66, height 5\' 10"  (1.778 m), weight 168 lb 9.6 oz (76.476 kg). General: Well developed, well nourished, in no acute distress.  Head: Normocephalic, atraumatic, sclera non-icteric, mucus membranes are moist,   Neck: Supple. Carotids are 2 + without bruits. No JVD  Lungs: Clear bilaterally to auscultation.  Heart: regular rate.  normal  S1 S2. No murmurs, gallops or rubs.  Abdomen: Soft, non-tender, non-distended with normal bowel sounds. No hepatomegaly. No rebound/guarding. No masses.  Msk:  Strength and tone are normal  Extremities: No clubbing or cyanosis. No edema.  Distal pedal pulses are 2+ and equal bilaterally.  Neuro: Alert and oriented X 3. Moves all extremities spontaneously.  Psych:  Responds to questions  appropriately with a normal affect.  ECG: The super 23, 2014: Sinus bradycardia at 59 beats a minute. He has nonspecific ST and T-wave adenopathy.  Assessment / Plan:

## 2014-03-20 NOTE — Assessment & Plan Note (Signed)
Darrell Wagner is doing well. Has not had episodes of chest pain or shortness of breath. He remains very active. We'll continue the same medications oxybutynin one year for followup visit. His glucose level was fairly elevated during his most recent lab work at 101. Although I told him not to worry about it, I think if he could improve that he cut down his intake of bagels slightly.  Will see him in 1 year.

## 2014-03-22 ENCOUNTER — Other Ambulatory Visit: Payer: Self-pay | Admitting: Cardiovascular Disease

## 2014-04-10 ENCOUNTER — Other Ambulatory Visit: Payer: Self-pay | Admitting: Physician Assistant

## 2014-04-16 ENCOUNTER — Other Ambulatory Visit: Payer: Self-pay | Admitting: Cardiovascular Disease

## 2014-05-27 ENCOUNTER — Ambulatory Visit (INDEPENDENT_AMBULATORY_CARE_PROVIDER_SITE_OTHER): Payer: BC Managed Care – PPO | Admitting: General Surgery

## 2014-06-13 ENCOUNTER — Other Ambulatory Visit: Payer: Self-pay | Admitting: Cardiovascular Disease

## 2014-07-17 NOTE — Progress Notes (Signed)
Please put orders in Epic surgery 07-31-14 pre op 07-23-14 Thanks 

## 2014-07-22 ENCOUNTER — Other Ambulatory Visit (INDEPENDENT_AMBULATORY_CARE_PROVIDER_SITE_OTHER): Payer: Self-pay | Admitting: General Surgery

## 2014-07-22 NOTE — Progress Notes (Signed)
Need orders in EPIC.. Surgery on 07/31/2014.  Preop on 07/23/14 at 1000am .  Thank You.

## 2014-07-22 NOTE — Patient Instructions (Addendum)
Darrell ElksJames M Wagner  07/22/2014   Your procedure is scheduled on:  07/31/2014    Come thru the Cancer [facility] entrance.   Follow the Signs to Short Stay Center at   0700     am  Call this number if you have problems the morning of surgery: [phone]   Remember:   Do not eat food or drink liquids after midnight.   Take these medicines the morning of surgery with A SIP OF WATER: Metoprolol ( Lopressor )    Do not wear jewelry,   Do not wear lotions, powders, or perfumes.  deodorant.   Men may shave face and neck.  Do not bring valuables to the hospital.  Contacts, dentures or bridgework may not be worn into surgery.  .     Patients discharged the day of surgery will not be allowed to drive  home.  Name and phone number of your driver:      Please read over the following fact sheets that you were given:  coughing and deep breathing exercises, leg exercises            [place] - Preparing for Surgery Before surgery, you can play an important role.  Because skin is not sterile, your skin needs to be as free of germs as possible.  You can reduce the number of germs on your skin by washing with CHG (chlorahexidine gluconate) soap before surgery.  CHG is an antiseptic cleaner which kills germs and bonds with the skin to continue killing germs even after washing. Please DO NOT use if you have an allergy to CHG or antibacterial soaps.  If your skin becomes reddened/irritated stop using the CHG and inform your nurse when you arrive at Short Stay. Do not shave (including legs and underarms) for at least 48 hours prior to the first CHG shower.  You may shave your face/neck. Please follow these instructions carefully:  1.  Shower with CHG Soap the night before surgery and the  morning of Surgery.  2.  If you choose to wash your hair, wash your hair first as usual with your  normal  shampoo.  3.  After you shampoo, rinse your hair and body thoroughly to remove the  shampoo.                           4.   Use CHG as you would any other liquid soap.  You can apply chg directly  to the skin and wash                       Gently with a scrungie or clean washcloth.  5.  Apply the CHG Soap to your body ONLY FROM THE NECK DOWN.   Do not use on face/ open                           Wound or open sores. Avoid contact with eyes, ears mouth and genitals (private parts).                       Wash face,  Genitals (private parts) with your normal soap.             6.  Wash thoroughly, paying special attention to the area where your surgery  will be performed.  7.  Thoroughly rinse your body with warm water from the  neck down.  8.  DO NOT shower/wash with your normal soap after using and rinsing off  the CHG Soap.                9.  Pat yourself dry with a clean towel.            10.  Wear clean pajamas.            11.  Place clean sheets on your bed the night of your first shower and do not  sleep with pets. Day of Surgery : Do not apply any lotions/deodorants the morning of surgery.  Please wear clean clothes to the hospital/surgery center.  FAILURE TO FOLLOW THESE INSTRUCTIONS MAY RESULT IN THE CANCELLATION OF YOUR SURGERY PATIENT SIGNATURE_________________________________  NURSE SIGNATURE__________________________________  ________________________________________________________________________

## 2014-07-23 ENCOUNTER — Ambulatory Visit (HOSPITAL_COMMUNITY)
Admission: RE | Admit: 2014-07-23 | Discharge: 2014-07-23 | Disposition: A | Discharge status: Home / Self Care | Payer: BC Managed Care – PPO | Source: Ambulatory Visit | Attending: General Surgery | Admitting: General Surgery

## 2014-07-23 ENCOUNTER — Encounter (HOSPITAL_COMMUNITY): Payer: Self-pay

## 2014-07-23 ENCOUNTER — Encounter (HOSPITAL_COMMUNITY)
Admission: RE | Admit: 2014-07-23 | Discharge: 2014-07-23 | Disposition: A | Discharge status: Home / Self Care | Payer: BC Managed Care – PPO | Source: Ambulatory Visit | Attending: General Surgery | Admitting: General Surgery

## 2014-07-23 DIAGNOSIS — I10 Essential (primary) hypertension: Secondary | ICD-10-CM

## 2014-07-23 DIAGNOSIS — Z01812 Encounter for preprocedural laboratory examination: Secondary | ICD-10-CM | POA: Insufficient documentation

## 2014-07-23 HISTORY — DX: Unspecified osteoarthritis, unspecified site: M19.90

## 2014-07-23 HISTORY — DX: Personal history of other diseases of the digestive system: Z87.19

## 2014-07-23 HISTORY — DX: Essential (primary) hypertension: I10

## 2014-07-23 LAB — BASIC METABOLIC PANEL
Anion gap: 10 (ref 5–15)
BUN: 17 mg/dL (ref 6–23)
CHLORIDE: 104 meq/L (ref 96–112)
CO2: 27 mEq/L (ref 19–32)
Calcium: 10.1 mg/dL (ref 8.4–10.5)
Creatinine, Ser: 0.97 mg/dL (ref 0.50–1.35)
GFR calc non Af Amer: 85 mL/min — ABNORMAL LOW (ref >90)
Glucose, Bld: 96 mg/dL (ref 70–99)
Potassium: 5 mEq/L (ref 3.7–5.3)
Sodium: 141 mEq/L (ref 137–147)

## 2014-07-23 LAB — CBC WITH DIFFERENTIAL/PLATELET
Basophils Absolute: 0 10*3/uL (ref 0.0–0.1)
Basophils Relative: 0 % (ref 0–1)
EOS ABS: 0.1 10*3/uL (ref 0.0–0.7)
Eosinophils Relative: 3 % (ref 0–5)
HCT: 43.8 % (ref 39.0–52.0)
Hemoglobin: 15.1 g/dL (ref 13.0–17.0)
LYMPHS ABS: 2.1 10*3/uL (ref 0.7–4.0)
Lymphocytes Relative: 42 % (ref 12–46)
MCH: 32.5 pg (ref 26.0–34.0)
MCHC: 34.5 g/dL (ref 30.0–36.0)
MCV: 94.4 fL (ref 78.0–100.0)
MONO ABS: 0.6 10*3/uL (ref 0.1–1.0)
Monocytes Relative: 11 % (ref 3–12)
NEUTROS PCT: 44 % (ref 43–77)
Neutro Abs: 2.2 10*3/uL (ref 1.7–7.7)
Platelets: 204 10*3/uL (ref 150–400)
RBC: 4.64 MIL/uL (ref 4.22–5.81)
RDW: 11.9 % (ref 11.5–15.5)
WBC: 5.1 10*3/uL (ref 4.0–10.5)

## 2014-07-23 NOTE — Progress Notes (Signed)
Stress 03/27/13 EPIC  EKG 09/02/13 EPIC  LOV with Dr Elease HashimotoNahser 03/20/14 EPIC

## 2014-07-24 NOTE — Progress Notes (Signed)
Spoke with pt wife Dennard Niptrisha Boyko and pat and wife are aware of surgery time changed arrive 755 am 07-31-14 [facility] short stay

## 2014-07-30 NOTE — H&P (Signed)
Darrell Wagner. Kever 05/27/2014 8:32 AM Location: [facility] Patient #: [id number] DOB: 01-09-1951 Married / Language: English / Race: White Male  History of Present Illness Darrell Wagner; 06/02/2014 10:35 PM) The patient is a 63 year old male who presents for an evaluation of a hernia. 2 yo WM who I initially met in 08/2012 for a left inguinal hernia comes back in to discuss hernia surgery. Since his retirement this past summer, he has been active and as such he reports more discomforts in his groin. He states he is feeling it more. He also reports some discomfort also down his left inner thigh. The discomfort will occur at random times. he takes flomax daily. he will also feel discomfort in his groin after hitting golf balls for example. he denies any fever, chills, nausea, vomiting. diarrhea, constipation.   Other Problems [name], CMA; 05/27/2014 8:34 AM) Arthritis Back Pain Enlarged Prostate Gastroesophageal Reflux Disease Hemorrhoids High blood pressure Hypercholesterolemia Inguinal Hernia Myocardial infarction  Past Surgical History [name], CMA; 05/27/2014 8:34 AM) Colon Polyp Removal - Colonoscopy Coronary Artery Bypass Graft Oral Surgery Tonsillectomy  Diagnostic Studies History [name], CMA; 05/27/2014 8:34 AM) Colonoscopy 1-5 years ago  Allergies [name], CMA; 05/27/2014 8:33 AM) Sulfa Antibiotics  Medication History ([name], CMA; 05/27/2014 8:33 AM) Crestor (10MG Tablet, Oral daily) Active. Metoprolol Tartrate (25MG Tablet, Oral daily) Active. Niacin ER (Antihyperlipidemic) (1000MG Tablet ER, Oral daily) Active. Pantoprazole Sodium (40MG Tablet DR, Oral daily) Active. Tamsulosin HCl (0.4MG Capsule, Oral daily) Active.  Review of Systems [name] CMA; 05/27/2014 8:34 AM) General Not Present- Appetite Loss, Chills, Fatigue, Fever, Night Sweats, Weight Gain and Weight Loss. Skin Not Present- Change in Wart/Mole,  Dryness, Hives, Jaundice, New Lesions, Non-Healing Wounds, Rash and Ulcer. HEENT Not Present- Earache, Hearing Loss, Hoarseness, Nose Bleed, Oral Ulcers, Ringing in the Ears, Seasonal Allergies, Sinus Pain, Sore Throat, Visual Disturbances, Wears glasses/contact lenses and Yellow Eyes. Respiratory Not Present- Bloody sputum, Chronic Cough, Difficulty Breathing, Snoring and Wheezing. Breast Not Present- Breast Mass, Breast Pain, Nipple Discharge and Skin Changes. Cardiovascular Not Present- Chest Pain, Difficulty Breathing Lying Down, Leg Cramps, Palpitations, Rapid Heart Rate, Shortness of Breath and Swelling of Extremities. Gastrointestinal Not Present- Abdominal Pain, Bloating, Bloody Stool, Change in Bowel Habits, Chronic diarrhea, Constipation, Difficulty Swallowing, Excessive gas, Gets full quickly at meals, Hemorrhoids, Indigestion, Nausea, Rectal Pain and Vomiting. Male Genitourinary Not Present- Blood in Urine, Change in Urinary Stream, Frequency, Impotence, Nocturia, Painful Urination, Urgency and Urine Leakage. Musculoskeletal Not Present- Back Pain, Joint Pain, Joint Stiffness, Muscle Pain, Muscle Weakness and Swelling of Extremities. Neurological Not Present- Decreased Memory, Fainting, Headaches, Numbness, Seizures, Tingling, Tremor, Trouble walking and Weakness. Psychiatric Not Present- Anxiety, Bipolar, Change in Sleep Pattern, Depression, Fearful and Frequent crying. Endocrine Not Present- Cold Intolerance, Excessive Hunger, Hair Changes, Heat Intolerance, Hot flashes and New Diabetes. Hematology Not Present- Easy Bruising, Excessive bleeding, Gland problems, HIV and Persistent Infections.   Vitals ([name] CMA; 05/27/2014 8:35 AM) 05/27/2014 8:34 AM Weight: 167 lb Height: 70in Body Surface Area: 1.93 m Body Mass Index: 23.96 kg/m Temp.: 55F(Temporal)  Pulse: 75 (Regular)  BP: 126/80 (Sitting, Left Arm, Standard)    Physical Exam Darrell Wagner; 06/02/2014  10:27 PM) General Mental Status-Alert. General Appearance-Consistent with stated age. Hydration-Well hydrated. Voice-Normal.  Head and Neck Head-normocephalic, atraumatic with no lesions or palpable masses. Trachea-midline. Thyroid Gland Characteristics - normal size and consistency.  Eye Eyeball - Bilateral-Extraocular movements intact. Sclera/Conjunctiva -  Bilateral-No scleral icterus.  Chest and Lung Exam Chest and lung exam reveals -quiet, even and easy respiratory effort with no use of accessory muscles and on auscultation, normal breath sounds, no adventitious sounds and normal vocal resonance. Inspection Chest Wall - Normal. Back - normal.  Cardiovascular Cardiovascular examination reveals -normal heart sounds, regular rate and rhythm with no murmurs, carotid auscultation reveals no bruits and normal pedal pulses bilaterally.  Abdomen Inspection Inspection of the abdomen reveals - No Hernias. Skin - Scar - no surgical scars. Palpation/Percussion Palpation and Percussion of the abdomen reveal - Soft, Non Tender, No Rebound tenderness, No Rigidity (guarding) and No hepatosplenomegaly. Auscultation Auscultation of the abdomen reveals - Bowel sounds normal.  Male Genitourinary Evaluation of genitourinary system reveals-scrotum non-tender, no masses and normal testes, no palpable masses. Note: REDUCIBLE LEFT INGUINAL HERNIA, SOFT, NT, ND   Rectal - Did not examine.  Peripheral Vascular Upper Extremity Inspection - Bilateral - Normal - No Clubbing, No Cyanosis, No Edema, Pulses Intact. Palpation - Pulses bilaterally normal. Lower Extremity Palpation - Pulses bilaterally normal.  Neurologic Neurologic evaluation reveals -alert and oriented x 3 with no impairment of recent or remote memory. Mental Status-Normal.  Musculoskeletal Normal Exam - Left-Upper Extremity Strength Normal and Lower Extremity Strength Normal. Normal Exam -  Right-Upper Extremity Strength Normal, Lower Extremity Weakness.  Lymphatic Head & Neck  General Head & Neck Lymphatics: Bilateral - Description - Normal. Femoral & Inguinal  Generalized Femoral & Inguinal Lymphatics: Bilateral - Description - Normal. Tenderness - Non Tender.    Assessment & Plan Darrell Wagner; 06/02/2014 10:35 PM) LEFT INGUINAL HERNIA (550.90  K40.90) Impression: We discussed the etiology of inguinal hernias. We discussed the signs & symptoms of incarceration & strangulation. We discussed non-operative and operative management.  The patient has elected to proceed with [place]  I described the procedure in detail. The patient was given educational material. We discussed the risks and benefits including but not limited to bleeding, infection, chronic inguinal pain, nerve entrapment, hernia recurrence, mesh complications, hematoma formation, urinary retention, injury to the testicles, numbness in the groin, blood clots, injury to the surrounding structures, and anesthesia risk. We also discussed the typical post operative recovery course, including no heavy lifting for 4-6 weeks. I explained that the likelihood of improvement of their symptoms is good. Current Plans  Schedule for Surgery Instructions: our office will contact you to schedule surgery. please call me if you haven't heard from [place] by next Monday. continue to take Flomax even on day of surgery   Signed by Gayland Curry, Wagner (06/02/2014 10:37 PM)  Leighton Ruff. Redmond Pulling, Wagner, FACS General, Bariatric, & Minimally Invasive Surgery [facility], [place]

## 2014-07-31 ENCOUNTER — Encounter (HOSPITAL_COMMUNITY): Payer: Self-pay

## 2014-07-31 ENCOUNTER — Ambulatory Visit (HOSPITAL_COMMUNITY): Payer: BC Managed Care – PPO | Admitting: Anesthesiology

## 2014-07-31 ENCOUNTER — Encounter (HOSPITAL_COMMUNITY)
Admission: RE | Disposition: A | Discharge status: Home / Self Care | Payer: Self-pay | Source: Ambulatory Visit | Attending: General Surgery

## 2014-07-31 ENCOUNTER — Ambulatory Visit (HOSPITAL_BASED_OUTPATIENT_CLINIC_OR_DEPARTMENT_OTHER)
Admission: RE | Admit: 2014-07-31 | Discharge: 2014-07-31 | Disposition: A | Discharge status: Home / Self Care | Payer: BC Managed Care – PPO | Source: Ambulatory Visit | Attending: General Surgery | Admitting: General Surgery

## 2014-07-31 DIAGNOSIS — I252 Old myocardial infarction: Secondary | ICD-10-CM | POA: Diagnosis not present

## 2014-07-31 DIAGNOSIS — N400 Enlarged prostate without lower urinary tract symptoms: Secondary | ICD-10-CM | POA: Insufficient documentation

## 2014-07-31 DIAGNOSIS — M1990 Unspecified osteoarthritis, unspecified site: Secondary | ICD-10-CM | POA: Diagnosis not present

## 2014-07-31 DIAGNOSIS — E780 Pure hypercholesterolemia: Secondary | ICD-10-CM | POA: Insufficient documentation

## 2014-07-31 DIAGNOSIS — K219 Gastro-esophageal reflux disease without esophagitis: Secondary | ICD-10-CM | POA: Diagnosis not present

## 2014-07-31 DIAGNOSIS — I10 Essential (primary) hypertension: Secondary | ICD-10-CM | POA: Insufficient documentation

## 2014-07-31 DIAGNOSIS — K4090 Unilateral inguinal hernia, without obstruction or gangrene, not specified as recurrent: Secondary | ICD-10-CM | POA: Diagnosis not present

## 2014-07-31 HISTORY — PX: INSERTION OF MESH: SHX5868

## 2014-07-31 HISTORY — PX: INGUINAL HERNIA REPAIR: SHX194

## 2014-07-31 SURGERY — REPAIR, HERNIA, INGUINAL, LAPAROSCOPIC
Anesthesia: General | Site: Abdomen

## 2014-07-31 MED ORDER — BUPIVACAINE-EPINEPHRINE 0.25% -1:200000 IJ SOLN
INTRAMUSCULAR | Status: AC
  Filled 2014-07-31: qty 1

## 2014-07-31 MED ORDER — FENTANYL CITRATE 0.05 MG/ML IJ SOLN
INTRAMUSCULAR | Status: AC
  Filled 2014-07-31: qty 5

## 2014-07-31 MED ORDER — NEOSTIGMINE METHYLSULFATE 10 MG/10ML IV SOLN
INTRAVENOUS | Status: AC
  Filled 2014-07-31: qty 1

## 2014-07-31 MED ORDER — CISATRACURIUM BESYLATE (PF) 10 MG/5ML IV SOLN
INTRAVENOUS | Status: DC | PRN
Start: 2014-07-31 — End: 2014-07-31
  Administered 2014-07-31: 10 mg via INTRAVENOUS

## 2014-07-31 MED ORDER — CEFAZOLIN SODIUM-DEXTROSE 2-3 GM-% IV SOLR
INTRAVENOUS | Status: AC
  Filled 2014-07-31: qty 50

## 2014-07-31 MED ORDER — MIDAZOLAM HCL 5 MG/5ML IJ SOLN
INTRAMUSCULAR | Status: DC | PRN
  Administered 2014-07-31: 2 mg via INTRAVENOUS

## 2014-07-31 MED ORDER — SODIUM CHLORIDE 0.9 % IJ SOLN: 3.0000 mL | Freq: Two times a day (BID) | INTRAMUSCULAR | Status: DC

## 2014-07-31 MED ORDER — OXYCODONE HCL 5 MG PO TABS: 5.0000 mg | ORAL_TABLET | ORAL | Status: DC | PRN

## 2014-07-31 MED ORDER — CEFAZOLIN SODIUM-DEXTROSE 2-3 GM-% IV SOLR
2.0000 g | INTRAVENOUS | Status: AC
  Administered 2014-07-31: 2 g via INTRAVENOUS

## 2014-07-31 MED ORDER — ACETAMINOPHEN 650 MG RE SUPP
650.0000 mg | RECTAL | Status: DC | PRN
  Filled 2014-07-31: qty 1

## 2014-07-31 MED ORDER — OXYCODONE-ACETAMINOPHEN 5-325 MG PO TABS: 1.0000 | ORAL_TABLET | ORAL | Status: DC | PRN

## 2014-07-31 MED ORDER — HYDROMORPHONE HCL 1 MG/ML IJ SOLN: 0.2500 mg | INTRAMUSCULAR | Status: DC | PRN

## 2014-07-31 MED ORDER — GLYCOPYRROLATE 0.2 MG/ML IJ SOLN
INTRAMUSCULAR | Status: AC
  Filled 2014-07-31: qty 3

## 2014-07-31 MED ORDER — ONDANSETRON HCL 4 MG PO TABS: 4.0000 mg | ORAL_TABLET | Freq: Three times a day (TID) | ORAL | Status: DC | PRN

## 2014-07-31 MED ORDER — PROPOFOL 10 MG/ML IV BOLUS
INTRAVENOUS | Status: DC | PRN
  Administered 2014-07-31: 120 mg via INTRAVENOUS

## 2014-07-31 MED ORDER — KETOROLAC TROMETHAMINE 30 MG/ML IJ SOLN
INTRAMUSCULAR | Status: AC
  Filled 2014-07-31: qty 1

## 2014-07-31 MED ORDER — LIDOCAINE HCL (CARDIAC) 20 MG/ML IV SOLN
INTRAVENOUS | Status: AC
  Filled 2014-07-31: qty 5

## 2014-07-31 MED ORDER — NEOSTIGMINE METHYLSULFATE 10 MG/10ML IV SOLN
INTRAVENOUS | Status: DC | PRN
  Administered 2014-07-31: 4 mg via INTRAVENOUS

## 2014-07-31 MED ORDER — KETOROLAC TROMETHAMINE 30 MG/ML IJ SOLN
INTRAMUSCULAR | Status: DC | PRN
  Administered 2014-07-31: 30 mg via INTRAVENOUS

## 2014-07-31 MED ORDER — KETOROLAC TROMETHAMINE 30 MG/ML IJ SOLN: 15.0000 mg | Freq: Once | INTRAMUSCULAR | Status: DC | PRN

## 2014-07-31 MED ORDER — ONDANSETRON HCL 4 MG/2ML IJ SOLN
INTRAMUSCULAR | Status: AC
  Filled 2014-07-31: qty 2

## 2014-07-31 MED ORDER — FENTANYL CITRATE 0.05 MG/ML IJ SOLN: 25.0000 ug | INTRAMUSCULAR | Status: DC | PRN

## 2014-07-31 MED ORDER — CHLORHEXIDINE GLUCONATE 4 % EX LIQD: 1.0000 "application " | Freq: Once | CUTANEOUS | Status: DC

## 2014-07-31 MED ORDER — LACTATED RINGERS IV SOLN
INTRAVENOUS | Status: DC | PRN
  Administered 2014-07-31: 09:00:00 via INTRAVENOUS

## 2014-07-31 MED ORDER — SODIUM CHLORIDE 0.9 % IV SOLN: 250.0000 mL | INTRAVENOUS | Status: DC | PRN

## 2014-07-31 MED ORDER — PROMETHAZINE HCL 25 MG/ML IJ SOLN: 6.2500 mg | INTRAMUSCULAR | Status: DC | PRN

## 2014-07-31 MED ORDER — FENTANYL CITRATE 0.05 MG/ML IJ SOLN
INTRAMUSCULAR | Status: DC | PRN
  Administered 2014-07-31: 100 ug via INTRAVENOUS

## 2014-07-31 MED ORDER — PROPOFOL 10 MG/ML IV BOLUS
INTRAVENOUS | Status: AC
  Filled 2014-07-31: qty 20

## 2014-07-31 MED ORDER — TAMSULOSIN HCL 0.4 MG PO CAPS
0.4000 mg | ORAL_CAPSULE | Freq: Once | ORAL | Status: AC
  Administered 2014-07-31: 0.4 mg via ORAL
  Filled 2014-07-31: qty 1

## 2014-07-31 MED ORDER — BUPIVACAINE-EPINEPHRINE 0.25% -1:200000 IJ SOLN
INTRAMUSCULAR | Status: DC | PRN
  Administered 2014-07-31: 40 mL
  Administered 2014-07-31: 10 mL

## 2014-07-31 MED ORDER — MIDAZOLAM HCL 2 MG/2ML IJ SOLN
INTRAMUSCULAR | Status: AC
  Filled 2014-07-31: qty 2

## 2014-07-31 MED ORDER — GLYCOPYRROLATE 0.2 MG/ML IJ SOLN
INTRAMUSCULAR | Status: DC | PRN
  Administered 2014-07-31: 0.6 mg via INTRAVENOUS

## 2014-07-31 MED ORDER — SODIUM CHLORIDE 0.9 % IJ SOLN: 3.0000 mL | INTRAMUSCULAR | Status: DC | PRN

## 2014-07-31 MED ORDER — ONDANSETRON HCL 4 MG/2ML IJ SOLN
INTRAMUSCULAR | Status: DC | PRN
  Administered 2014-07-31: 4 mg via INTRAVENOUS

## 2014-07-31 MED ORDER — ACETAMINOPHEN 325 MG PO TABS: 650.0000 mg | ORAL_TABLET | ORAL | Status: DC | PRN

## 2014-07-31 MED ORDER — LACTATED RINGERS IV SOLN
INTRAVENOUS | Status: DC
  Administered 2014-07-31: 1000 mL via INTRAVENOUS

## 2014-07-31 MED ORDER — LIDOCAINE HCL (CARDIAC) 20 MG/ML IV SOLN
INTRAVENOUS | Status: DC | PRN
  Administered 2014-07-31: 80 mg via INTRAVENOUS

## 2014-07-31 SURGICAL SUPPLY — 33 items
BANDAGE ADH SHEER 1  50/CT (GAUZE/BANDAGES/DRESSINGS) IMPLANT
BENZOIN TINCTURE PRP APPL 2/3 (GAUZE/BANDAGES/DRESSINGS) IMPLANT
CANISTER SUCTION 2500CC (MISCELLANEOUS) ×3 IMPLANT
DECANTER SPIKE VIAL GLASS SM (MISCELLANEOUS) ×6 IMPLANT
DEVICE SECURE STRAP 25 ABSORB (INSTRUMENTS) ×3 IMPLANT
DRAPE LAPAROSCOPIC ABDOMINAL (DRAPES) ×3 IMPLANT
DRSG TEGADERM 2-3/8X2-3/4 SM (GAUZE/BANDAGES/DRESSINGS) IMPLANT
DRSG TEGADERM 4X4.75 (GAUZE/BANDAGES/DRESSINGS) IMPLANT
ELECT REM PT RETURN 9FT ADLT (ELECTROSURGICAL) ×3
ELECTRODE REM PT RTRN 9FT ADLT (ELECTROSURGICAL) ×2 IMPLANT
GLOVE BIO SURGEON STRL SZ7.5 (GLOVE) ×6 IMPLANT
GLOVE BIOGEL M STRL SZ7.5 (GLOVE) ×6 IMPLANT
GLOVE INDICATOR 8.0 STRL GRN (GLOVE) ×3 IMPLANT
GOWN STRL REUS W/TWL XL LVL3 (GOWN DISPOSABLE) ×9 IMPLANT
KIT BASIN OR (CUSTOM PROCEDURE TRAY) ×3 IMPLANT
MESH ULTRAPRO 3X6 7.6X15CM (Mesh General) ×3 IMPLANT
NS IRRIG 1000ML POUR BTL (IV SOLUTION) ×3 IMPLANT
RELOAD STAPLE HERNIA 4.0 BLUE (INSTRUMENTS) IMPLANT
RELOAD STAPLE HERNIA 4.8 BLK (STAPLE) IMPLANT
SCALPEL HARMONIC ACE (MISCELLANEOUS) IMPLANT
SCISSORS LAP 5X35 DISP (ENDOMECHANICALS) IMPLANT
SET IRRIG TUBING LAPAROSCOPIC (IRRIGATION / IRRIGATOR) IMPLANT
SOLUTION ANTI FOG 6CC (MISCELLANEOUS) ×3 IMPLANT
STAPLER HERNIA 12 8.5 360D (INSTRUMENTS) ×3 IMPLANT
STRIP CLOSURE SKIN 1/2X4 (GAUZE/BANDAGES/DRESSINGS) IMPLANT
SUT MNCRL AB 4-0 PS2 18 (SUTURE) ×3 IMPLANT
TOWEL OR 17X26 10 PK STRL BLUE (TOWEL DISPOSABLE) ×3 IMPLANT
TOWEL OR NON WOVEN STRL DISP B (DISPOSABLE) ×3 IMPLANT
TRAY LAPAROSCOPIC (CUSTOM PROCEDURE TRAY) ×3 IMPLANT
TROCAR BLADELESS OPT 5 75 (ENDOMECHANICALS) ×6 IMPLANT
TROCAR SLEEVE XCEL 5X75 (ENDOMECHANICALS) ×3 IMPLANT
TROCAR XCEL BLUNT TIP 100MML (ENDOMECHANICALS) ×3 IMPLANT
TUBING INSUFFLATION 10FT LAP (TUBING) IMPLANT

## 2014-07-31 NOTE — Discharge Instructions (Signed)
[facility] ° °UMBILICAL OR INGUINAL HERNIA REPAIR: POST OP INSTRUCTIONS ° °Always review your discharge instruction sheet given to you by the facility where your surgery was performed. °IF YOU HAVE DISABILITY OR FAMILY LEAVE FORMS, YOU MUST BRING THEM TO THE OFFICE FOR PROCESSING.   °DO NOT GIVE THEM TO YOUR DOCTOR. ° °1. A  prescription for pain medication may be given to you upon discharge.  Take your pain medication as prescribed, if needed.  If narcotic pain medicine is not needed, then you may take acetaminophen (Tylenol) or ibuprofen (Advil) as needed. °2. Take your usually prescribed medications unless otherwise directed. °3. If you need a refill on your pain medication, please contact your pharmacy.  They will contact our office to request authorization. Prescriptions will not be filled after 5 pm or on week-ends. °4. You should follow a light diet the first 24 hours after arrival home, such as soup and crackers, etc.  Be sure to include lots of fluids daily.  Resume your normal diet the day after surgery. °5. Most patients will experience some swelling and bruising around the umbilicus or in the groin and scrotum.  Ice packs and reclining will help.  Swelling and bruising can take several days to resolve.  °6. It is common to experience some constipation if taking pain medication after surgery.  Increasing fluid intake and taking a stool softener (such as Colace) will usually help or prevent this problem from occurring.  A mild laxative (Milk of Magnesia or Miralax) should be taken according to package directions if there are no bowel movements after 48 hours. °7. If your surgeon used skin glue on the incision, you may shower in 24 hours.  The glue will flake off over the next 2-3 weeks.  Any sutures or staples will be removed at the office during your follow-up visit. °8. ACTIVITIES:  You may resume regular (light) daily activities beginning the next day--such as daily self-care,  walking, climbing stairs--gradually increasing activities as tolerated.  You may have sexual intercourse when it is comfortable.  Refrain from any heavy lifting or straining until approved by your doctor. °a. You may drive when you are no longer taking prescription pain medication, you can comfortably wear a seatbelt, and you can safely maneuver your car and apply brakes. °b. RETURN TO WORK:  °9. You should see your doctor in the office for a follow-up appointment approximately 2-3 weeks after your surgery.  Make sure that you call for this appointment within a day or two after you arrive home to insure a convenient appointment time. °10. OTHER INSTRUCTIONS: DO NOT LIFT, PUSH, OR PULL ANYTHING GREATER THAN 10 POUNDS FOR 4 WEEKS  ° ° °WHEN TO CALL YOUR DOCTOR: °1. Fever over 101.0 °2. Inability to urinate °3. Nausea and/or vomiting °4. Extreme swelling or bruising °5. Continued bleeding from incision. °6. Increased pain, redness, or drainage from the incision ° °The clinic staff is available to answer your questions during regular business hours.  Please don’t hesitate to call and ask to speak to one of the nurses for clinical concerns.  If you have a medical emergency, go to the nearest emergency room or call 911.  A surgeon from [facility] is always on call at the hospital ° ° °[address], [address], [place], [place]  [address] ? ° [address] [address], , [place]   [address] °[phone] ? [phone] ? FAX [phone] °Web site: [email or web address] ° °Post Anesthesia Home   Care Instructions ° °Activity: °Get plenty of rest for the remainder of the day. A responsible adult should stay with you for 24 hours following the procedure.  °For the next 24 hours, DO NOT: °-Drive a car °-Operate machinery °-Drink alcoholic beverages °-Take any medication unless instructed by your physician °-Make any legal decisions or sign important papers. ° °Meals: °Start with liquid foods such as  gelatin or soup. Progress to regular foods as tolerated. Avoid greasy, spicy, heavy foods. If nausea and/or vomiting occur, drink only clear liquids until the nausea and/or vomiting subsides. Call your physician if vomiting continues. ° °Special Instructions/Symptoms: °Your throat may feel dry or sore from the anesthesia or the breathing tube placed in your throat during surgery. If this causes discomfort, gargle with warm salt water. The discomfort should disappear within 24 hours. ° °

## 2014-07-31 NOTE — Transfer of Care (Signed)
Immediate Anesthesia Transfer of Care Note  Patient: Darrell Wagner  Procedure(s) Performed: Procedure(s): LAPAROSCOPIC INGUINAL HERNIA WITH MESH (Left) INSERTION OF MESH (N/A)  Patient Location: PACU  Anesthesia Type:General  Level of Consciousness: awake, alert  and oriented  Airway & Oxygen Therapy: Patient Spontanous Breathing and Patient connected to face mask oxygen  Post-op Assessment: Report given to PACU RN and Post -op Vital signs reviewed and stable  Post vital signs: Reviewed and stable  Complications: No apparent anesthesia complications

## 2014-07-31 NOTE — Anesthesia Preprocedure Evaluation (Addendum)
Anesthesia Evaluation  Patient identified by MRN, date of birth, ID band Patient awake    Reviewed: Allergy & Precautions, H&P , NPO status , Patient's Chart, lab work & pertinent test results  Airway Mallampati: II  TM Distance: >3 FB Neck ROM: Full    Dental  (+) Chipped,    Pulmonary neg pulmonary ROS,  breath sounds clear to auscultation  Pulmonary exam normal       Cardiovascular hypertension, Pt. on medications + CAD, + Past MI and + CABG Rhythm:Regular Rate:Normal  Perfusion: There are no relative decreased counts on stress or rest to suggest reversible ischemia or infarction.  Wall motion: septal hypokinesia. Normal contractility.  Left ventricular ejection fraction: Calculated left ventricular ejection fraction = 67%.  IMPRESSION:  1. No reversible ischemia or infarction. 2. Septal hypokinesia.  3. Left ventricular ejection fraction equal67%   Neuro/Psych negative neurological ROS  negative psych ROS   GI/Hepatic Neg liver ROS, GERD-  Medicated,  Endo/Other  negative endocrine ROS  Renal/GU negative Renal ROS  negative genitourinary   Musculoskeletal negative musculoskeletal ROS (+)   Abdominal   Peds negative pediatric ROS (+)  Hematology negative hematology ROS (+)   Anesthesia Other Findings   Reproductive/Obstetrics negative OB ROS                          Anesthesia Physical Anesthesia Plan  ASA: III  Anesthesia Plan: General   Post-op Pain Management:    Induction: Intravenous  Airway Management Planned: Oral ETT  Additional Equipment:   Intra-op Plan:   Post-operative Plan: Extubation in OR  Informed Consent: I have reviewed the patients History and Physical, chart, labs and discussed the procedure including the risks, benefits and alternatives for the proposed anesthesia with the patient or authorized representative who has indicated his/her  understanding and acceptance.   Dental advisory given  Plan Discussed with: CRNA and Surgeon  Anesthesia Plan Comments:         Anesthesia Quick Evaluation

## 2014-07-31 NOTE — Op Note (Signed)
07/31/2014  [facility] 11/03/1950   PREOPERATIVE DIAGNOSIS: Left inguinal hernia.   POSTOPERATIVE DIAGNOSIS: Left direct inguinal hernia.   PROCEDURE: Laparoscopic repair of Left direct inguinal hernia with  mesh (TAPP).   SURGEON: Darrell Sellaric Wagner. Andrey CampanileWilson, MD FACS  ASSISTANT SURGEON: None.   ANESTHESIA: General plus local consisting of 0.25% Marcaine with epi.   ESTIMATED BLOOD LOSS: Minimal.   FINDINGS: The patient had a defect medial to inferior epigastric vessels on left c/w direct defect.  It was repaired using a 3 inch x 6  inch piece of Ethicon UltraPro mesh.   SPECIMEN: none  INDICATIONS FOR PROCEDURE: 63 yo WM with symptomatic left inguinal hernia presented for elective laparoscopic repair.  The risks and benefits including but not limited to bleeding, infection, chronic inguinal pain, nerve entrapment, hernia recurrence, mesh complications, hematoma formation, urinary retention, injury to the testicles or the ovaries, numbness in the groin, blood clots, injury to the surrounding structures, and anesthesia risk was discussed with the patient.  DESCRIPTION OF PROCEDURE: After obtaining verbal consent and marking  the left groin in the holding area with the patient confirming the operative site, the patient was then taken back to the operating room, placed supine on the operating room table. General endotracheal anesthesia was  established. The patient had emptied their bladder prior to going back to the operating room. Sequential compression devices were placed. The abdomen and groin were prepped and draped in the usual standard surgical  fashion with ChloraPrep. The patient received  IV antibiotics prior to the incision. A surgical time-out was performed. Local was infiltrated at the base of the umbilicus.   Next, a 1-cm vertical infraumbilical incision was made with a #11 blade. The fascia was grasped and lifted anteriorly. Next, the fascia was incised, and  the abdominal cavity  was entered. Pursestring suture was placed around the fascial edges using a 0 Vicryl. A 12-mm Hasson trocar was placed.  Pneumoperitoneum was smoothly established up to a patient pressure of 15 mmHg. Laparoscope was advanced. There was no evidence of a  contralateral hernia. The patient had a defect medial to the inferior epigastric vessel, consistent with an left direct inguinal hernia. Two 5-mm trocars were placed, one on the right, one on the left  in the midclavicular line slightly above the level of the umbilicus all under direct visualization. After local had been infiltrated, I then made incision along the peritoneum on the left, starting 2 inches above  the anterior superior iliac spine and caring it medial toward the median umbilical ligament in a lazy S configuration using Endo Shears with electrocautery. The peritoneal flap was then gently  dissected downward from the anterior abdominal wall taking care not to injure the inferior epigastric vessels. The pubic bone was identified. The testicular vessels were identified.  Using  traction and counter traction with short graspers, I reduced the sac in its entirety. The testicular vessels had been identified and preserved. The vas deferens was identified and preserved, and the hernia sac was stripped and reduced.  I then went about creating a large pocket by lifting the peritoneum of the pelvic floor. I took great care not to injure the iliac vessels.    Local anesthetic was injected 2 finger breadths below and medial to the anterior superior iliac spine as well as along the  groin prior to placing the mesh. I then obtained a piece of Ethicon UltraPro mesh 3 inch x 6 inch, placed it through the Hasson trocar, half  of it covered medial to the inferior epigastric vessels and half of it lateral to the  inferior epigastric vessels. The defect was well covered with the mesh. I then secured the mesh to the abdominal wall using an Ethicon secure strap tack.  Tacks were placed through  the Cooper's ligament, one tack on each side of the inferior epigastric vessel and two tacks out laterally, one tack along superior medial edge of mesh. No tacks were placed below the shelving edge of the inguinal ligament. Pneumoperitoneum was reduced to 8 mmHg. I then brought the peritoneal flap back up to the abdominal wall and tacked it to the abdominal wall using 4 tacks. There was no defect in the peritoneum, and the mesh was well covered. I removed the  Hasson trocar and tied down the previously placed pursestring suture. The closure was viewed laparoscopically. There was no evidence of fascial defect. There was no air leak at the umbilicus. There was no  evidence of injury to surrounding structures. Pneumoperitoneum was released, and the remaining trocars were removed. All skin incisions were closed with a 4-0 Monocryl in a subcuticular fashion followed by  application of Liquid band exceed. All needle, instrument, and sponge counts were correct x2. There are no immediate complications. The patient tolerated the procedure well. The patient was extubated and taken to the  recovery room in stable condition.  Darrell SellaEric Wagner. Andrey CampanileWilson, MD, FACS General, Bariatric, & Minimally Invasive Surgery [facility] [place]

## 2014-07-31 NOTE — Anesthesia Postprocedure Evaluation (Signed)
  Anesthesia Post-op Note  Patient: Darrell Wagner  Procedure(s) Performed: Procedure(s) (LRB): LAPAROSCOPIC INGUINAL HERNIA WITH MESH (Left) INSERTION OF MESH (N/A)  Patient Location: PACU  Anesthesia Type: General  Level of Consciousness: awake and alert   Airway and Oxygen Therapy: Patient Spontanous Breathing  Post-op Pain: mild  Post-op Assessment: Post-op Vital signs reviewed, Patient's Cardiovascular Status Stable, Respiratory Function Stable, Patent Airway and No signs of Nausea or vomiting  Last Vitals:  Filed Vitals:   07/31/14 1134  BP: 108/62  Pulse:   Temp: 36.3 C  Resp: 16    Post-op Vital Signs: stable   Complications: No apparent anesthesia complications

## 2014-07-31 NOTE — Interval H&P Note (Signed)
History and Physical Interval Note:  07/31/2014 8:50 AM  Genene ChurnJames M Heesch  has presented today for surgery, with the diagnosis of left inguinal hernia  The various methods of treatment have been discussed with the patient and family. After consideration of risks, benefits and other options for treatment, the patient has consented to  Procedure(s): LAPAROSCOPIC INGUINAL HERNIA WITH MESH (Left), POSSIBLE RIGHT REPAIR INSERTION OF MESH (N/A) as a surgical intervention .  The patient's history has been reviewed, patient examined, no change in status, stable for surgery.  I have reviewed the patient's chart and labs.  Questions were answered to the patient's satisfaction.    [name], MD, FACS General, Bariatric, & Minimally Invasive Surgery [facility] [place]  [facility] LPWILSON,Khristian Phillippi M

## 2014-08-03 ENCOUNTER — Encounter (HOSPITAL_COMMUNITY): Payer: Self-pay | Admitting: General Surgery

## 2014-09-23 ENCOUNTER — Other Ambulatory Visit: Payer: Self-pay | Admitting: Cardiovascular Disease

## 2014-10-20 ENCOUNTER — Other Ambulatory Visit: Payer: Self-pay | Admitting: Cardiovascular Disease

## 2015-01-06 ENCOUNTER — Other Ambulatory Visit: Payer: Self-pay | Admitting: Cardiovascular Disease

## 2015-01-31 ENCOUNTER — Other Ambulatory Visit: Payer: Self-pay | Admitting: Cardiovascular Disease

## 2015-02-20 ENCOUNTER — Other Ambulatory Visit: Payer: Self-pay | Admitting: Cardiovascular Disease

## 2015-04-13 ENCOUNTER — Other Ambulatory Visit (INDEPENDENT_AMBULATORY_CARE_PROVIDER_SITE_OTHER): Payer: BC Managed Care – PPO | Admitting: *Deleted

## 2015-04-13 DIAGNOSIS — I2510 Atherosclerotic heart disease of native coronary artery without angina pectoris: Secondary | ICD-10-CM | POA: Diagnosis not present

## 2015-04-13 LAB — HEPATIC FUNCTION PANEL
ALT: 16 U/L (ref 0–53)
AST: 23 U/L (ref 0–37)
Albumin: 4 g/dL (ref 3.5–5.2)
Alkaline Phosphatase: 69 U/L (ref 39–117)
Bilirubin, Direct: 0.1 mg/dL (ref 0.0–0.3)
Total Bilirubin: 0.4 mg/dL (ref 0.2–1.2)
Total Protein: 6.9 g/dL (ref 6.0–8.3)

## 2015-04-13 LAB — LIPID PANEL
CHOL/HDL RATIO: 3
Cholesterol: 112 mg/dL (ref 0–200)
HDL: 42.1 mg/dL (ref >39.00)
LDL Cholesterol: 61 mg/dL (ref 0–99)
NonHDL: 70.28
Triglycerides: 48 mg/dL (ref 0.0–149.0)
VLDL: 9.6 mg/dL (ref 0.0–40.0)

## 2015-04-13 LAB — BASIC METABOLIC PANEL
BUN: 13 mg/dL (ref 6–23)
CO2: 29 mEq/L (ref 19–32)
CREATININE: 1.1 mg/dL (ref 0.40–1.50)
Calcium: 9.3 mg/dL (ref 8.4–10.5)
Chloride: 108 mEq/L (ref 96–112)
GFR: 71.44 mL/min (ref >60.00)
GLUCOSE: 88 mg/dL (ref 70–99)
Potassium: 3.9 mEq/L (ref 3.5–5.1)
SODIUM: 142 meq/L (ref 135–145)

## 2015-04-13 NOTE — Addendum Note (Signed)
Addended by: Tonita Phoenix on: 04/13/2015 09:57 AM   Modules accepted: Orders

## 2015-04-13 NOTE — Addendum Note (Signed)
Addended by: Bentley Haralson K on: 04/13/2015 09:57 AM   Modules accepted: Orders  

## 2015-04-15 ENCOUNTER — Encounter: Payer: Self-pay | Admitting: Cardiovascular Disease

## 2015-04-15 ENCOUNTER — Ambulatory Visit (INDEPENDENT_AMBULATORY_CARE_PROVIDER_SITE_OTHER): Payer: BC Managed Care – PPO | Admitting: Cardiovascular Disease

## 2015-04-15 VITALS — BP 132/74 | HR 77 | Ht 70.0 in | Wt 173.8 lb

## 2015-04-15 DIAGNOSIS — E785 Hyperlipidemia, unspecified: Secondary | ICD-10-CM

## 2015-04-15 DIAGNOSIS — I2510 Atherosclerotic heart disease of native coronary artery without angina pectoris: Secondary | ICD-10-CM | POA: Diagnosis not present

## 2015-04-15 NOTE — Patient Instructions (Signed)
Medication Instructions:  Your physician recommends that you continue on your current medications as directed. Please refer to the Current Medication list given to you today.   Labwork: Your physician recommends that you return for lab work in: 6 months on the day of or a few days before your office visit with Dr. Nahser.  YoElease Hashimotoill need to FAST for this appointment - nothing to eat or drink after midnight the night before except water.   Testing/Procedures: none  Follow-Up: Your physician wants you to follow-up in: 6 months with Dr. Elease Hashimoto. You will receive a reminder letter in the mail two months in advance. If you don't receive a letter, please call our office to schedule the follow-up appointment.

## 2015-04-15 NOTE — Progress Notes (Signed)
Darrell Wagner Date of Birth  22-Apr-1951       [facility] Office [address], [address]  [address], [address] [place], [place]  [address]   [place], [place]  [address] [phone]     [phone]   Fax  [phone]    Fax [phone]  Problem List: 1. Coronary artery disease-status post CABG- July, 2010-  Doing great ,  No angina   2. Transient atrial fibrillation-  Continues to have NSR   3. Hyperlipidemia - lipids are well   History of Present Illness: Darrell Wagner is a 64 year old gentleman with a history of coronary artery disease-status post CABG. He also had transient atrial fibrillation. [name] done well since I last saw him. [name] not having episodes of chest pain or shortness of breath.  He has done well. [name] done very well since I last saw him. Not had any episodes of chest pain or shortness of breath.  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  He is feeling better now that it's summer.    October 10, 2012: Darrell Wagner is doing very well from a cardiac standpoint. [name] not having any episodes of angina. He denies any palpitations.  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  April 24, 2013:  Darrell Wagner continues to do well. He was admitted to the hospital for chest pain  in mid July. He had a stress Myoview study which was normal. His ejection fraction is 67%.  He has had some intermittant episodes of "gas pain " .  He has noticed improvement with protonix.    Dec. 23, 2014:  No cardiac issues.  [name] not had any cardiac issues. He is not exercising as much as he thinks he should. He is not having at that any episodes of pain doing his normal activities.  March 20, 2014:  He has occasional episodes of heartburn. He knows that  eats too late at night.  Exercising some - rides his bike occasionally.    April 15, 2015:  Darrell Wagner is doing well.    Current Outpatient Prescriptions on File Prior to Visit  Medication Sig  Dispense Refill  . aspirin 81 MG tablet Take 81 mg by mouth daily.    . Coenzyme Q10 (CO Q 10) 100 MG CAPS Take 1 capsule by mouth daily.    . metoprolol tartrate (LOPRESSOR) 25 MG tablet TAKE ONE TABLET BY MOUTH TWICE DAILY 180 tablet 1  . niacin (NIASPAN) 1000 MG CR tablet TAKE ONE TABLET BY MOUTH NIGHTLY AT BEDTIME 90 tablet 0  . ranitidine (ZANTAC) 150 MG tablet Take 150 mg by mouth daily.    . rosuvastatin (CRESTOR) 10 MG tablet TAKE ONE TABLET BY MOUTH ONE TIME DAILY 30 tablet 11  . tamsulosin (FLOMAX) 0.4 MG CAPS capsule Take 0.4 mg by mouth daily.    . vitamin C (ASCORBIC ACID) 500 MG tablet Take 500 mg by mouth daily.     No current facility-administered medications on file prior to visit.    Allergies  Allergen Reactions  . Sulfa Drugs Cross Reactors Other (See Comments)    unknown    Past Medical History  Diagnosis Date  . Coronary artery disease   . GERD (gastroesophageal reflux disease)   . Myocardial infarct 2010  . Hypertension   . History of hiatal hernia   . Arthritis     Past Surgical History  Procedure  Laterality Date  . Tonsillectomy    . Tibia fracture surgery    . Coronary artery bypass graft  2010    x4  . Inguinal hernia repair Left 07/31/2014    Procedure: LAPAROSCOPIC INGUINAL HERNIA WITH MESH;  Surgeon: Atilano Ina, MD;  Location: [facility];  Service: General;  Laterality: Left;  . Insertion of mesh N/A 07/31/2014    Procedure: INSERTION OF MESH;  Surgeon: Atilano Ina, MD;  Location: [facility];  Service: General;  Laterality: N/A;    History  Smoking status  . Never Smoker   Smokeless tobacco  . Never Used    History  Alcohol Use No    Family History  Problem Relation Age of Onset  . Hypertension Mother   . Hypertension Sister     Reviw of Systems:  Reviewed in the HPI.  All other systems are negative.  Physical Exam: Blood pressure 132/74, pulse 77, height  (1.778 m), weight 78.835 kg (173 lb 12.8 oz). General: Well  developed, well nourished, in no acute distress.  Head: Normocephalic, atraumatic, sclera non-icteric, mucus membranes are moist,   Neck: Supple. Carotids are 2 + without bruits. No JVD  Lungs: Clear bilaterally to auscultation.  Heart: regular rate.  normal  S1 S2. No murmurs, gallops or rubs.  Abdomen: Soft, non-tender, non-distended with normal bowel sounds. No hepatomegaly. No rebound/guarding. No masses.  Msk:  Strength and tone are normal  Extremities: No clubbing or cyanosis. No edema.  Distal pedal pulses are 2+ and equal bilaterally.  Neuro: Alert and oriented X 3. Moves all extremities spontaneously.  Psych:  Responds to questions appropriately with a normal affect.  ECG: reviewed by me  Aug. 4, 2016:  NSR at 77.  PVCs  Assessment / Plan:   1. Coronary artery disease-status post CABG- July, 2010 [name] doing very well. He exercises several times a day without angina. [name] done extremely well since his CABG  2. Transient atrial fibrillation - he has maintained NSR   3. Hyperlipidemia -   his lipids are well controlled. Continue Crestor 10 mg a day   Darrell Wagner, Deloris Ping, MD  04/15/2015 9:32 AM    [facility] [address] [place],  [address] [place], [place]  [address] Pager [phone] Phone: [phone]; Fax: [phone]   [facility]  [address] [place], [place]  [address] [phone]   Fax [phone]

## 2015-07-09 ENCOUNTER — Other Ambulatory Visit: Payer: Self-pay | Admitting: Cardiovascular Disease

## 2015-07-09 ENCOUNTER — Encounter: Payer: Self-pay | Admitting: Cardiovascular Disease

## 2015-07-25 ENCOUNTER — Other Ambulatory Visit: Payer: Self-pay | Admitting: Cardiovascular Disease

## 2015-10-08 ENCOUNTER — Other Ambulatory Visit (INDEPENDENT_AMBULATORY_CARE_PROVIDER_SITE_OTHER): Payer: PRIVATE HEALTH INSURANCE | Admitting: *Deleted

## 2015-10-08 DIAGNOSIS — I2510 Atherosclerotic heart disease of native coronary artery without angina pectoris: Secondary | ICD-10-CM | POA: Diagnosis not present

## 2015-10-08 LAB — LIPID PANEL
Cholesterol: 109 mg/dL — ABNORMAL LOW (ref 125–200)
HDL: 43 mg/dL (ref >=40)
LDL Cholesterol: 53 mg/dL (ref <130)
Total CHOL/HDL Ratio: 2.5 Ratio (ref <=5.0)
Triglycerides: 64 mg/dL (ref <150)
VLDL: 13 mg/dL (ref <30)

## 2015-10-08 LAB — BASIC METABOLIC PANEL
BUN: 19 mg/dL (ref 7–25)
CO2: 22 mmol/L (ref 20–31)
CREATININE: 1.22 mg/dL (ref 0.70–1.25)
Calcium: 9.4 mg/dL (ref 8.6–10.3)
Chloride: 103 mmol/L (ref 98–110)
GLUCOSE: 81 mg/dL (ref 65–99)
Potassium: 4 mmol/L (ref 3.5–5.3)
Sodium: 140 mmol/L (ref 135–146)

## 2015-10-08 LAB — HEPATIC FUNCTION PANEL
ALK PHOS: 68 U/L (ref 40–115)
ALT: 17 U/L (ref 9–46)
AST: 25 U/L (ref 10–35)
Albumin: 3.9 g/dL (ref 3.6–5.1)
BILIRUBIN DIRECT: 0.1 mg/dL (ref <=0.2)
Indirect Bilirubin: 0.4 mg/dL (ref 0.2–1.2)
Total Bilirubin: 0.5 mg/dL (ref 0.2–1.2)
Total Protein: 7.1 g/dL (ref 6.1–8.1)

## 2015-10-08 NOTE — Addendum Note (Signed)
Addended by: Tonita Phoenix on: 10/08/2015 07:37 AM   Modules accepted: Orders

## 2015-10-08 NOTE — Addendum Note (Signed)
Addended by: Tonita Phoenix on: 10/08/2015 09:16 AM   Modules accepted: Orders

## 2015-10-12 ENCOUNTER — Ambulatory Visit (INDEPENDENT_AMBULATORY_CARE_PROVIDER_SITE_OTHER): Payer: PRIVATE HEALTH INSURANCE | Admitting: Cardiovascular Disease

## 2015-10-12 ENCOUNTER — Encounter: Payer: Self-pay | Admitting: Cardiovascular Disease

## 2015-10-12 VITALS — BP 108/80 | HR 76 | Ht 70.0 in | Wt 173.0 lb

## 2015-10-12 DIAGNOSIS — E785 Hyperlipidemia, unspecified: Secondary | ICD-10-CM

## 2015-10-12 DIAGNOSIS — I2510 Atherosclerotic heart disease of native coronary artery without angina pectoris: Secondary | ICD-10-CM

## 2015-10-12 NOTE — Patient Instructions (Signed)

## 2015-10-12 NOTE — Progress Notes (Signed)
Darrell Wagner Date of Birth  1951-04-10       [facility] Office [address]  [address] [place], [place]  [address]   [place], [place]  [address] [phone]     [phone]   Fax  [phone]    Fax [phone]  Problem List: 1. Coronary artery disease-status post CABG- July, 2010-  Doing great ,  No angina   2. Transient atrial fibrillation-  Continues to have NSR   3. Hyperlipidemia - lipids are well   History of Present Illness: Darrell Wagner is a 65 year old Wagner with a history of coronary artery disease-status post CABG. He also had transient atrial fibrillation. [name] done well since I last saw him. [name] not having episodes of chest pain or shortness of breath.  He has done well. [name] done very well since I last saw him. Not had any episodes of chest pain or shortness of breath.  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  He is feeling better now that it's summer.    October 10, 2012: Darrell Wagner is doing very well from a cardiac standpoint. [name] not having any episodes of angina. He denies any palpitations.  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  April 24, 2013:  Darrell Wagner continues to do well. He was admitted to the hospital for chest pain  in mid July. He had a stress Myoview study which was normal. His ejection fraction is 67%.  He has had some intermittant episodes of "gas pain " .  He has noticed improvement with protonix.    Dec. 23, 2014:  No cardiac issues.  [name] not had any cardiac issues. He is not exercising as much as he thinks he should. He is not having at that any episodes of pain doing his normal activities.  March 20, 2014:  He has occasional episodes of heartburn. He knows that  eats too late at night.  Exercising some - rides his bike occasionally.    April 15, 2015:  Darrell Wagner is doing well.   Jan. 31 2017:  Darrell Wagner is doing well Some back issues, no cardiac issues.   Exercises some .  Walks 2-3 miles a day  Labs were reviewed.     Current Outpatient Prescriptions on File Prior to Visit  Medication Sig Dispense Refill  . aspirin 81 MG tablet Take 81 mg by mouth daily.    . Coenzyme Q10 (CO Q 10) 100 MG CAPS Take 1 capsule by mouth daily.    . metoprolol tartrate (LOPRESSOR) 25 MG tablet TAKE ONE TABLET BY MOUTH TWICE DAILY 180 tablet 3  . niacin (NIASPAN) 1000 MG CR tablet TAKE ONE TABLET BY MOUTH NIGHTLY AT BEDTIME 90 tablet 2  . ranitidine (ZANTAC) 150 MG tablet Take 150 mg by mouth daily.    . rosuvastatin (CRESTOR) 10 MG tablet TAKE ONE TABLET BY MOUTH ONE TIME DAILY 30 tablet 11  . tamsulosin (FLOMAX) 0.4 MG CAPS capsule Take 0.4 mg by mouth daily.    . vitamin C (ASCORBIC ACID) 500 MG tablet Take 500 mg by mouth daily.     No current facility-administered medications on file prior to visit.    Allergies  Allergen Reactions  . Sulfa Drugs Cross Reactors Other (See Comments)    unknown    Past Medical History  Diagnosis Date  . Coronary artery disease   . GERD (gastroesophageal  reflux disease)   . Myocardial infarct (HCC) 2010  . Hypertension   . History of hiatal hernia   . Arthritis     Past Surgical History  Procedure Laterality Date  . Tonsillectomy    . Tibia fracture surgery    . Coronary artery bypass graft  2010    x4  . Inguinal hernia repair Left 07/31/2014    Procedure: LAPAROSCOPIC INGUINAL HERNIA WITH MESH;  Surgeon: Atilano Ina, MD;  Location: [facility];  Service: General;  Laterality: Left;  . Insertion of mesh N/A 07/31/2014    Procedure: INSERTION OF MESH;  Surgeon: Atilano Ina, MD;  Location: [facility];  Service: General;  Laterality: N/A;    History  Smoking status  . Never Smoker   Smokeless tobacco  . Never Used    History  Alcohol Use No    Family History  Problem Relation Age of Onset  . Hypertension Mother   . Hypertension Sister     Reviw of Systems:  Reviewed in the HPI.  All other  systems are negative.  Physical Exam: Blood pressure 108/80, pulse 76, height  (1.778 m), weight 173 lb (78.472 kg). General: Well developed, well nourished, in no acute distress.  Head: Normocephalic, atraumatic, sclera non-icteric, mucus membranes are moist,   Neck: Supple. Carotids are 2 + without bruits. No JVD  Lungs: Clear bilaterally to auscultation.  Heart: regular rate.  normal  S1 S2. No murmurs, gallops or rubs.  Abdomen: Soft, non-tender, non-distended with normal bowel sounds. No hepatomegaly. No rebound/guarding. No masses.  Msk:  Strength and tone are normal  Extremities: No clubbing or cyanosis. No edema.  Distal pedal pulses are 2+ and equal bilaterally.  Neuro: Alert and oriented X 3. Moves all extremities spontaneously.  Psych:  Responds to questions appropriately with a normal affect.  ECG: reviewed by me  Aug. 4, 2016:  NSR at 77.  PVCs  Assessment / Plan:   1. Coronary artery disease-status post CABG- July, 2010 [name] doing very well. He exercises several times a day without angina. [name] done extremely well since his CABG  2. Transient atrial fibrillation - he has maintained NSR   3. Hyperlipidemia -   Recent labs look great . Continue Crestor 10 mg a day  4. Minimal elevation of creatinine .   We discussed the very minimal elevation of his creatinine.  Will keep an eye on that   Will see him in 6 months   Darrell Wagner, Deloris Ping, MD  10/12/2015 10:56 AM    [facility] [address] [place],  [address] [place], [place]  [address] Pager [phone] Phone: [phone]; Fax: [phone]   [facility]  [address] [place], [place]  [address] [phone]   Fax [phone]

## 2016-02-09 ENCOUNTER — Telehealth: Payer: Self-pay | Admitting: Cardiovascular Disease

## 2016-02-09 DIAGNOSIS — E785 Hyperlipidemia, unspecified: Secondary | ICD-10-CM

## 2016-02-09 NOTE — Telephone Encounter (Signed)
NEW MESSAGE   PT wants labs ordered before his appt

## 2016-02-09 NOTE — Telephone Encounter (Signed)
Will route to Dr. Elease HashimotoNahser for review and advisement on possible labs he may want to order prior to pt's appt. Pt's last labs were a BMET, Lipids and LFTs on 10/08/15.

## 2016-02-10 NOTE — Telephone Encounter (Signed)
Left detailed message for patient to call back to schedule fasting lab appointment for lipid panel and CMET.  Orders in epic.

## 2016-02-10 NOTE — Telephone Encounter (Signed)
Darrell Wagner Darrell Wagner need a CMET and lipid profile. He may get these done prior to his visit or get them the same day

## 2016-02-13 ENCOUNTER — Other Ambulatory Visit: Payer: Self-pay | Admitting: Cardiovascular Disease

## 2016-04-26 ENCOUNTER — Other Ambulatory Visit: Payer: Medicare Other | Admitting: *Deleted

## 2016-04-26 DIAGNOSIS — E785 Hyperlipidemia, unspecified: Secondary | ICD-10-CM

## 2016-04-26 LAB — LIPID PANEL
CHOL/HDL RATIO: 2.5 ratio (ref <=5.0)
Cholesterol: 118 mg/dL — ABNORMAL LOW (ref 125–200)
HDL: 48 mg/dL (ref >=40)
LDL CALC: 58 mg/dL (ref <130)
Triglycerides: 60 mg/dL (ref <150)
VLDL: 12 mg/dL (ref <30)

## 2016-04-26 LAB — COMPREHENSIVE METABOLIC PANEL
ALT: 12 U/L (ref 9–46)
AST: 17 U/L (ref 10–35)
Albumin: 3.9 g/dL (ref 3.6–5.1)
Alkaline Phosphatase: 64 U/L (ref 40–115)
BILIRUBIN TOTAL: 0.5 mg/dL (ref 0.2–1.2)
BUN: 18 mg/dL (ref 7–25)
CO2: 26 mmol/L (ref 20–31)
Calcium: 9.6 mg/dL (ref 8.6–10.3)
Chloride: 107 mmol/L (ref 98–110)
Creat: 1.3 mg/dL — ABNORMAL HIGH (ref 0.70–1.25)
GLUCOSE: 89 mg/dL (ref 65–99)
POTASSIUM: 4.2 mmol/L (ref 3.5–5.3)
Sodium: 142 mmol/L (ref 135–146)
Total Protein: 6.6 g/dL (ref 6.1–8.1)

## 2016-04-28 ENCOUNTER — Ambulatory Visit (INDEPENDENT_AMBULATORY_CARE_PROVIDER_SITE_OTHER): Payer: Medicare Other | Admitting: Cardiovascular Disease

## 2016-04-28 ENCOUNTER — Encounter: Payer: Self-pay | Admitting: Cardiovascular Disease

## 2016-04-28 VITALS — BP 96/70 | HR 64 | Ht 70.0 in | Wt 169.0 lb

## 2016-04-28 DIAGNOSIS — I2510 Atherosclerotic heart disease of native coronary artery without angina pectoris: Secondary | ICD-10-CM

## 2016-04-28 DIAGNOSIS — E785 Hyperlipidemia, unspecified: Secondary | ICD-10-CM | POA: Diagnosis not present

## 2016-04-28 NOTE — Patient Instructions (Signed)

## 2016-04-28 NOTE — Progress Notes (Signed)
Darrell Wagner Date of Birth  10/09/1950       [facility]     Office [address]  [address] [place], [place]  [address]   [place], [place]  [address] [phone]     [phone]   Fax  [phone]    Fax [phone]  Problem List: 1. Coronary artery disease-status post CABG- July, 2010-  Doing great ,  No angina   2. Transient atrial fibrillation-  Continues to have NSR   3. Hyperlipidemia - lipids are well   History of Present Illness: Darrell Wagner is a 65 year old gentleman with a history of coronary artery disease-status post CABG. He also had transient atrial fibrillation. [name] done well since I last saw him. [name] not having episodes of chest pain or shortness of breath.  He has done well. [name] done very well since I last saw him. Not had any episodes of chest pain or shortness of breath.  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  He is feeling better now that it's summer.    October 10, 2012: Darrell Wagner is doing very well from a cardiac standpoint. [name] not having any episodes of angina. He denies any palpitations.  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  April 24, 2013:  Darrell Wagner continues to do well. He was admitted to the hospital for chest pain  in mid July. He had a stress Myoview study which was normal. His ejection fraction is 67%.  He has had some intermittant episodes of "gas pain " .  He has noticed improvement with protonix.    Dec. 23, 2014:  No cardiac issues.  [name] not had any cardiac issues. He is not exercising as much as he thinks he should. He is not having at that any episodes of pain doing his normal activities.  March 20, 2014:  He has occasional episodes of heartburn. He knows that  eats too late at night.  Exercising some - rides his bike occasionally.    April 15, 2015:  Darrell Wagner is doing well.   Jan. 31 2017:  Darrell Wagner is doing well Some back issues, no cardiac issues.   Exercises some .  Walks 2-3 miles a day  Labs were reviewed.   Aug. 18, 2017:  Darrell Wagner is seen today for follow-up of his coronary artery disease and coronary artery bypass grafting. Labs are good Creatinine is slightly higher    Current Outpatient Prescriptions on File Prior to Visit  Medication Sig Dispense Refill  . aspirin 81 MG tablet Take 81 mg by mouth daily.    [name]. FLUZONE HIGH-DOSE 0.5 ML SUSY Inject as directed once.  0  . metoprolol tartrate (LOPRESSOR) 25 MG tablet TAKE ONE TABLET BY MOUTH TWICE DAILY 180 tablet 3  . niacin (NIASPAN) 1000 MG CR tablet TAKE ONE TABLET BY MOUTH NIGHTLY AT BEDTIME 90 tablet 2  . ranitidine (ZANTAC) 150 MG tablet Take 150 mg by mouth daily.    . rosuvastatin (CRESTOR) 10 MG tablet TAKE ONE TABLET BY MOUTH ONE TIME DAILY 30 tablet 6  . tamsulosin (FLOMAX) 0.4 MG CAPS capsule Take 0.4 mg by mouth daily.    . Coenzyme Q10 (CO Q 10) 100 MG CAPS Take 1 capsule by mouth daily.     No current facility-administered medications on file prior to visit.     Allergies  Allergen Reactions  . Sulfa Drugs [employer] Reactors Other (  See Comments)    unknown    Past Medical History:  Diagnosis Date  . Arthritis   . Coronary artery disease   . GERD (gastroesophageal reflux disease)   . History of hiatal hernia   . Hypertension   . Myocardial infarct [facility](HCC) 2010    Past Surgical History:  Procedure Laterality Date  . CORONARY ARTERY BYPASS GRAFT  2010   x4  . INGUINAL HERNIA REPAIR Left 07/31/2014   Procedure: LAPAROSCOPIC INGUINAL HERNIA WITH MESH;  Surgeon: Atilano InaEric M Wilson, MD;  Location: [facility];  Service: General;  Laterality: Left;  . INSERTION OF MESH N/A 07/31/2014   Procedure: INSERTION OF MESH;  Surgeon: Atilano InaEric M Wilson, MD;  Location: [facility];  Service: General;  Laterality: N/A;  . TIBIA FRACTURE SURGERY    . TONSILLECTOMY      History  Smoking Status  . Never Smoker  Smokeless Tobacco  . Never Used    History  Alcohol Use No    Family  History  Problem Relation Age of Onset  . Hypertension Mother   . Hypertension Sister     Reviw of Systems:  Reviewed in the HPI.  All other systems are negative.  Physical Exam: Blood pressure 96/70, pulse 64, height 5\' 10"  (1.778 m), weight 169 lb (76.7 kg). General: Well developed, well nourished, in no acute distress.  Head: Normocephalic, atraumatic, sclera non-icteric, mucus membranes are moist,   Neck: Supple. Carotids are 2 + without bruits. No JVD  Lungs: Clear bilaterally to auscultation.  Heart: regular rate.  normal  S1 S2. No murmurs, gallops or rubs.  Abdomen: Soft, non-tender, non-distended with normal bowel sounds. No hepatomegaly. No rebound/guarding. No masses.  Msk:  Strength and tone are normal  Extremities: No clubbing or cyanosis. No edema.  Distal pedal pulses are 2+ and equal bilaterally.  Neuro: Alert and oriented X 3. Moves all extremities spontaneously.  Psych:  Responds to questions appropriately with a normal affect.  ECG: reviewed by me  Apr 28, 2016: NSR at 2064.   1st degree AV block   Assessment / Plan:   1. Coronary artery disease-status post CABG- July, 2010 [name] doing very well. He exercises several times a day without angina. [name] done extremely well since his CABG  2. Transient atrial fibrillation - he has maintained NSR   3. Hyperlipidemia -   Recent labs look great . Continue Crestor 10 mg a day  4. Minimal elevation of creatinine .   We discussed the very minimal elevation of his creatinine.  Will keep an eye on that  Has mild BPH.   Suggested that he discuss with his Primary MD   Will see him in 6 months   Kristeen MissPhilip Ulla Mckiernan, MD  04/28/2016 2:33 PM    [facility] [address],  [address] [place], [place]  [address] Pager [phone] Phone: [phone]; Fax: [phone]   [facility] Office  [address] [place], [place]  [address] [phone]   Fax [phone]

## 2016-07-25 ENCOUNTER — Other Ambulatory Visit: Payer: Self-pay | Admitting: Cardiovascular Disease

## 2016-07-25 ENCOUNTER — Other Ambulatory Visit: Payer: Self-pay | Admitting: *Deleted

## 2016-07-25 MED ORDER — METOPROLOL TARTRATE 25 MG PO TABS: 25.0000 mg | ORAL_TABLET | Freq: Two times a day (BID) | ORAL | 2 refills | Status: DC

## 2016-08-20 ENCOUNTER — Other Ambulatory Visit: Payer: Self-pay | Admitting: Cardiovascular Disease

## 2016-09-13 ENCOUNTER — Other Ambulatory Visit: Payer: Self-pay | Admitting: Cardiovascular Disease

## 2016-11-08 ENCOUNTER — Other Ambulatory Visit: Payer: Medicare Other | Admitting: *Deleted

## 2016-11-08 DIAGNOSIS — I2510 Atherosclerotic heart disease of native coronary artery without angina pectoris: Secondary | ICD-10-CM

## 2016-11-08 DIAGNOSIS — E785 Hyperlipidemia, unspecified: Secondary | ICD-10-CM

## 2016-11-08 LAB — COMPREHENSIVE METABOLIC PANEL
ALT: 17 IU/L (ref 0–44)
AST: 23 IU/L (ref 0–40)
Albumin/Globulin Ratio: 1.6 (ref 1.2–2.2)
Albumin: 4.1 g/dL (ref 3.6–4.8)
Alkaline Phosphatase: 66 IU/L (ref 39–117)
BILIRUBIN TOTAL: 0.5 mg/dL (ref 0.0–1.2)
BUN/Creatinine Ratio: 13 (ref 10–24)
BUN: 15 mg/dL (ref 8–27)
CO2: 24 mmol/L (ref 18–29)
Calcium: 9.2 mg/dL (ref 8.6–10.2)
Chloride: 102 mmol/L (ref 96–106)
Creatinine, Ser: 1.13 mg/dL (ref 0.76–1.27)
GFR calc non Af Amer: 67 mL/min/{1.73_m2} (ref >59)
GFR, EST AFRICAN AMERICAN: 78 mL/min/{1.73_m2} (ref >59)
GLOBULIN, TOTAL: 2.6 g/dL (ref 1.5–4.5)
Glucose: 91 mg/dL (ref 65–99)
Potassium: 4 mmol/L (ref 3.5–5.2)
SODIUM: 142 mmol/L (ref 134–144)
TOTAL PROTEIN: 6.7 g/dL (ref 6.0–8.5)

## 2016-11-08 LAB — LIPID PANEL
Chol/HDL Ratio: 2.4 ratio units (ref 0.0–5.0)
Cholesterol, Total: 120 mg/dL (ref 100–199)
HDL: 49 mg/dL (ref >39)
LDL CALC: 60 mg/dL (ref 0–99)
Triglycerides: 55 mg/dL (ref 0–149)
VLDL Cholesterol Cal: 11 mg/dL (ref 5–40)

## 2016-11-08 NOTE — Addendum Note (Signed)
Addended by: Tonita PhoenixBOWDEN, Kaislyn Gulas K on: 11/08/2016 10:57 AM   Modules accepted: Orders

## 2016-11-10 ENCOUNTER — Ambulatory Visit (INDEPENDENT_AMBULATORY_CARE_PROVIDER_SITE_OTHER): Payer: Medicare Other | Admitting: Cardiovascular Disease

## 2016-11-10 ENCOUNTER — Encounter: Payer: Self-pay | Admitting: Cardiovascular Disease

## 2016-11-10 VITALS — BP 100/70 | HR 73 | Ht 70.0 in | Wt 172.0 lb

## 2016-11-10 DIAGNOSIS — I2510 Atherosclerotic heart disease of native coronary artery without angina pectoris: Secondary | ICD-10-CM

## 2016-11-10 DIAGNOSIS — E782 Mixed hyperlipidemia: Secondary | ICD-10-CM

## 2016-11-10 NOTE — Progress Notes (Signed)
Darrell Wagner Date of Birth  09/02/1951       [facility] Office [address], [address]  [address] [place], [place]  [address]   [place], [place]  [address] [phone]     [phone]   Fax  [phone]    Fax [phone]  Problem List: 1. Coronary artery disease-status post CABG- July, 2010-  Doing great ,  No angina   2. Transient atrial fibrillation-  Continues to have NSR   3. Hyperlipidemia - lipids are well   History of Present Illness: Darrell Wagner is a 66 year old gentleman with a history of coronary artery disease-status post CABG. He also had transient atrial fibrillation. [name] done well since I last saw him. [name] not having episodes of chest pain or shortness of breath.  He has done well. [name] done very well since I last saw him. Not had any episodes of chest pain or shortness of breath.  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  He is feeling better now that it's summer.    October 10, 2012: Darrell Wagner is doing very well from a cardiac standpoint. [name] not having any episodes of angina. He denies any palpitations.  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  April 24, 2013:  Darrell Wagner continues to do well. He was admitted to the hospital for chest pain  in mid July. He had a stress Myoview study which was normal. His ejection fraction is 67%.  He has had some intermittant episodes of "gas pain " .  He has noticed improvement with protonix.    Dec. 23, 2014:  No cardiac issues.  [name] not had any cardiac issues. He is not exercising as much as he thinks he should. He is not having at that any episodes of pain doing his normal activities.  March 20, 2014:  He has occasional episodes of heartburn. He knows that  eats too late at night.  Exercising some - rides his bike occasionally.    April 15, 2015:  Darrell Wagner is doing well.   Jan. 31 2017:  Darrell Wagner is doing well Some back issues, no cardiac issues.   Exercises some .  Walks 2-3 miles a day  Labs were reviewed.   Aug. 18, 2017:  Darrell Wagner is seen today for follow-up of his coronary artery disease and coronary artery bypass grafting. Labs are good Creatinine is slightly higher   November 10, 2016:  Doing well.  No CP or dyspnea.   Lipids from Feb. 28 look great .    Current Outpatient Prescriptions on File Prior to Visit  Medication Sig Dispense Refill  . aspirin 81 MG tablet Take 81 mg by mouth daily.    . Coenzyme Q10 (CO Q 10) 100 MG CAPS Take 1 capsule by mouth daily.    [name] FLUZONE HIGH-DOSE 0.5 ML SUSY Inject as directed once.  0  . metoprolol tartrate (LOPRESSOR) 25 MG tablet Take 1 tablet (25 mg total) by mouth 2 (two) times daily. 180 tablet 2  . niacin (NIASPAN) 1000 MG CR tablet TAKE ONE TABLET BY MOUTH NIGHTLY AT BEDTIME 90 tablet 2  . ranitidine (ZANTAC) 150 MG tablet Take 150 mg by mouth daily.    . rosuvastatin (CRESTOR) 10 MG tablet TAKE 1 TABLET EVERY DAY 30 tablet 3  . tamsulosin (FLOMAX) 0.4 MG CAPS capsule Take 0.4 mg by mouth daily.  No current facility-administered medications on file prior to visit.     Allergies  Allergen Reactions  . Sulfa Drugs Cross Reactors Other (See Comments)    unknown    Past Medical History:  Diagnosis Date  . Arthritis   . Coronary artery disease   . GERD (gastroesophageal reflux disease)   . History of hiatal hernia   . Hypertension   . Myocardial infarct 2010    Past Surgical History:  Procedure Laterality Date  . CORONARY ARTERY BYPASS GRAFT  2010   x4  . INGUINAL HERNIA REPAIR Left 07/31/2014   Procedure: LAPAROSCOPIC INGUINAL HERNIA WITH MESH;  Surgeon: Atilano InaEric M Wilson, MD;  Location: [facility];  Service: General;  Laterality: Left;  . INSERTION OF MESH N/A 07/31/2014   Procedure: INSERTION OF MESH;  Surgeon: Atilano InaEric M Wilson, MD;  Location: [facility];  Service: General;  Laterality: N/A;  . TIBIA FRACTURE SURGERY    . TONSILLECTOMY      History  Smoking Status  .  Never Smoker  Smokeless Tobacco  . Never Used    History  Alcohol Use No    Family History  Problem Relation Age of Onset  . Hypertension Mother   . Hypertension Sister     Reviw of Systems:  Reviewed in the HPI.  All other systems are negative.  Physical Exam: Blood pressure 100/70, pulse 73, height 5\' 10"  (1.778 m), weight 172 lb (78 kg), SpO2 98 %. General: Well developed, well nourished, in no acute distress.  Head: Normocephalic, atraumatic, sclera non-icteric, mucus membranes are moist,  Neck: Supple. Carotids are 2 + without bruits. No JVD Lungs: Clear bilaterally to auscultation. Heart: regular rate.  normal  S1 S2. No murmurs, gallops or rubs. Abdomen: Soft, non-tender, non-distended with normal bowel sounds. No hepatomegaly. No rebound/guarding. No masses. Msk:  Strength and tone are normal Extremities: No clubbing or cyanosis. No edema.  Distal pedal pulses are 2+ and equal bilaterally. Neuro: Alert and oriented X 3. Moves all extremities spontaneously.  Psych:  Responds to questions appropriately with a normal affect.  ECG: reviewed by me  Apr 28, 2016: NSR at 6767.   1st degree AV block   Assessment / Plan:   1. Coronary artery disease-status post CABG- July, 2010 [name] doing very well. He exercises several times a day without angina. [name] done extremely well since his CABG Exercises periodically   2. Transient atrial fibrillation - he has maintained NSR   3. Hyperlipidemia -   Recent labs look great . Continue Crestor 10 mg a day    Will see him in 6 months , recheck labs at that time   Darrell MissPhilip Sharnay Cashion, MD  11/10/2016 9:23 AM    [facility] [address]  [address] [place], [place]  [address] Pager [phone] Phone: [phone]; Fax: [phone]

## 2016-11-10 NOTE — Patient Instructions (Signed)

## 2016-11-11 ENCOUNTER — Other Ambulatory Visit: Payer: Self-pay | Admitting: Cardiovascular Disease

## 2017-01-09 ENCOUNTER — Other Ambulatory Visit: Payer: Self-pay | Admitting: Cardiovascular Disease

## 2017-01-10 ENCOUNTER — Other Ambulatory Visit: Payer: Self-pay | Admitting: Cardiovascular Disease

## 2017-05-23 ENCOUNTER — Other Ambulatory Visit: Payer: Self-pay | Admitting: Cardiovascular Disease

## 2017-07-08 ENCOUNTER — Other Ambulatory Visit: Payer: Self-pay | Admitting: Cardiovascular Disease

## 2017-07-13 ENCOUNTER — Other Ambulatory Visit: Payer: Medicare Other | Admitting: *Deleted

## 2017-07-13 DIAGNOSIS — I2510 Atherosclerotic heart disease of native coronary artery without angina pectoris: Secondary | ICD-10-CM

## 2017-07-13 DIAGNOSIS — E782 Mixed hyperlipidemia: Secondary | ICD-10-CM

## 2017-07-13 LAB — COMPREHENSIVE METABOLIC PANEL
A/G RATIO: 1.9 (ref 1.2–2.2)
ALBUMIN: 4.3 g/dL (ref 3.6–4.8)
ALK PHOS: 74 IU/L (ref 39–117)
ALT: 14 IU/L (ref 0–44)
AST: 22 IU/L (ref 0–40)
BILIRUBIN TOTAL: 0.3 mg/dL (ref 0.0–1.2)
BUN / CREAT RATIO: 13 (ref 10–24)
BUN: 16 mg/dL (ref 8–27)
CHLORIDE: 105 mmol/L (ref 96–106)
CO2: 24 mmol/L (ref 20–29)
Calcium: 9.5 mg/dL (ref 8.6–10.2)
Creatinine, Ser: 1.23 mg/dL (ref 0.76–1.27)
GFR calc Af Amer: 70 mL/min/{1.73_m2} (ref >59)
GFR calc non Af Amer: 60 mL/min/{1.73_m2} (ref >59)
GLOBULIN, TOTAL: 2.3 g/dL (ref 1.5–4.5)
Glucose: 98 mg/dL (ref 65–99)
POTASSIUM: 4.3 mmol/L (ref 3.5–5.2)
SODIUM: 142 mmol/L (ref 134–144)
Total Protein: 6.6 g/dL (ref 6.0–8.5)

## 2017-07-13 LAB — LIPID PANEL
CHOLESTEROL TOTAL: 103 mg/dL (ref 100–199)
Chol/HDL Ratio: 2.5 ratio (ref 0.0–5.0)
HDL: 41 mg/dL (ref >39)
LDL CALC: 51 mg/dL (ref 0–99)
Triglycerides: 56 mg/dL (ref 0–149)
VLDL CHOLESTEROL CAL: 11 mg/dL (ref 5–40)

## 2017-07-17 ENCOUNTER — Encounter: Payer: Self-pay | Admitting: Cardiovascular Disease

## 2017-07-17 ENCOUNTER — Ambulatory Visit (INDEPENDENT_AMBULATORY_CARE_PROVIDER_SITE_OTHER): Payer: Medicare Other | Admitting: Cardiovascular Disease

## 2017-07-17 VITALS — BP 100/70 | HR 70 | Ht 70.0 in | Wt 172.0 lb

## 2017-07-17 DIAGNOSIS — E782 Mixed hyperlipidemia: Secondary | ICD-10-CM

## 2017-07-17 DIAGNOSIS — I2510 Atherosclerotic heart disease of native coronary artery without angina pectoris: Secondary | ICD-10-CM | POA: Diagnosis not present

## 2017-07-17 MED ORDER — METOPROLOL TARTRATE 25 MG PO TABS: 25.0000 mg | ORAL_TABLET | Freq: Two times a day (BID) | ORAL | 3 refills | Status: DC

## 2017-07-17 NOTE — Patient Instructions (Signed)
Medication Instructions:  Your physician recommends that you continue on your current medications as directed. Please refer to the Current Medication list given to you today.   Labwork: Your physician recommends that you return for lab work in: 6 months on the day of or a few days before your office visit with Dr. Elease HashimotoNahser.  You will need to FAST for this appointment - nothing to eat or drink after midnight the night before except water.    Testing/Procedures: None Ordered   Follow-Up: Your physician wants you to follow-up in: 6 months with Dr. Elease HashimotoNahser.  You will receive a reminder letter in the mail two months in advance. If you don't receive a letter, please call our office to schedule the follow-up appointment.   If you need a refill on your cardiac medications before your next appointment, please call your pharmacy.    Thank you for choosing [facility] Shoshana Johal RN [phone]

## 2017-07-17 NOTE — Progress Notes (Signed)
Darrell Wagner Date of Birth  November 28, 1950       [facility] Office [address], [address]  [address] [place], [place]  [address]   [place], [place]  [address] [phone]     [phone]   Fax  [phone]    Fax [phone]  Problem List: 1. Coronary artery disease-status post CABG- July, 2010-    2. Transient atrial fibrillation-    3. Hyperlipidemia - lipids are well   History of Present Illness: Darrell Wagner is a 66 year old gentleman with a history of coronary artery disease-status post CABG. He also had transient atrial fibrillation. [name] done well since I last saw him. [name] not having episodes of chest pain or shortness of breath.  He has done well. [name] done very well since I last saw him. Not had any episodes of chest pain or shortness of breath.  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  He is feeling better now that it's summer.    October 10, 2012: Darrell Wagner is doing very well from a cardiac standpoint. [name] not having any episodes of angina. He denies any palpitations.  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  April 24, 2013:  Darrell Wagner continues to do well. He was admitted to the hospital for chest pain  in mid July. He had a stress Myoview study which was normal. His ejection fraction is 67%.  He has had some intermittant episodes of "gas pain " .  He has noticed improvement with protonix.    Dec. 23, 2014:  No cardiac issues.  [name] not had any cardiac issues. He is not exercising as much as he thinks he should. He is not having at that any episodes of pain doing his normal activities.  March 20, 2014:  He has occasional episodes of heartburn. He knows that  eats too late at night.  Exercising some - rides his bike occasionally.    April 15, 2015:  Darrell Wagner is doing well.   Jan. 31 2017:  Darrell Wagner is doing well Some back issues, no cardiac issues.  Exercises some .  Walks 2-3 miles a day  Labs were  reviewed.   Aug. 18, 2017:  Darrell Wagner is seen today for follow-up of his coronary artery disease and coronary artery bypass grafting. Labs are good Creatinine is slightly higher   November 10, 2016:  Doing well.  No CP or dyspnea.   Lipids from Feb. 28 look great .   Nov. 6, 2018:     Followed for CAD - CABG in 2010 , , hyperlipidemia .  No CP or dyspnea.   BP is well controlled Walks and rides a bike regularly   Current Outpatient Medications on File Prior to Visit  Medication Sig Dispense Refill  . aspirin 81 MG tablet Take 81 mg by mouth daily.    . Coenzyme Q10 (CO Q 10) 100 MG CAPS Take 1 capsule by mouth daily.    [name] FLUZONE HIGH-DOSE 0.5 ML SUSY Inject as directed once.  0  . metoprolol tartrate (LOPRESSOR) 25 MG tablet Take 1 tablet (25 mg total) by mouth 2 (two) times daily. 180 tablet 2  . metoprolol tartrate (LOPRESSOR) 25 MG tablet TAKE ONE TABLET BY MOUTH TWICE DAILY 180 tablet 1  . niacin (NIASPAN) 1000 MG CR tablet TAKE ONE TABLET BY MOUTH NIGHTLY AT BEDTIME 90 tablet 1  . ranitidine (ZANTAC)  150 MG tablet Take 150 mg by mouth daily.    . rosuvastatin (CRESTOR) 10 MG tablet Take 1 tablet (10 mg total) by mouth daily. 30 tablet 11  . tamsulosin (FLOMAX) 0.4 MG CAPS capsule Take 0.4 mg by mouth daily.     No current facility-administered medications on file prior to visit.     Allergies  Allergen Reactions  . Sulfa Drugs Cross Reactors Other (See Comments)    unknown    Past Medical History:  Diagnosis Date  . Arthritis   . Coronary artery disease   . GERD (gastroesophageal reflux disease)   . History of hiatal hernia   . Hypertension   . Myocardial infarct [facility]) 2010    Past Surgical History:  Procedure Laterality Date  . CORONARY ARTERY BYPASS GRAFT  2010   x4  . TIBIA FRACTURE SURGERY    . TONSILLECTOMY      Social History   Tobacco Use  Smoking Status Never Smoker  Smokeless Tobacco Never Used    Social History   Substance and Sexual Activity   Alcohol Use No    Family History  Problem Relation Age of Onset  . Hypertension Mother   . Hypertension Sister     Reviw of Systems:  Reviewed in the HPI.  All other systems are negative.  Physical Exam: Blood pressure 100/70, pulse 70, height 5\' 10"  (1.778 m), weight 172 lb (78 kg), SpO2 99 %.  GEN:  Well nourished, well developed in no acute distress HEENT: Normal NECK: No JVD; No carotid bruits LYMPHATICS: No lymphadenopathy CARDIAC: RR , no murmurs, rubs, gallops RESPIRATORY:  Clear to auscultation without rales, wheezing or rhonchi  ABDOMEN: Soft, non-tender, non-distended MUSCULOSKELETAL:  No edema; No deformity  SKIN: Warm and dry NEUROLOGIC:  Alert and oriented x 3    Assessment / Plan:   1. Coronary artery disease-status post CABG- July, 2010   Doing well .  No angina .  Continue aggressive lipid lowering therapy.  Continue exercise   2. Transient atrial fibrillation - no further episodes of AF   3. Hyperlipidemia -    Continue crestor 10 mg , niaspan    Will see him in 6 months , recheck labs at that time   Kristeen MissPhilip Frenchie Pribyl, MD  07/17/2017 8:25 AM    [facility] [address],  [address] [place], [place]  [address] Pager [phone] Phone: [phone]; Fax: [phone]) [phone]

## 2017-07-27 ENCOUNTER — Telehealth: Payer: Self-pay | Admitting: Cardiovascular Disease

## 2017-07-27 NOTE — Telephone Encounter (Signed)
SPOKE WITH PT HAD SOME CONCERNS RE SEEING EKG IN MY CHART AND IT READING ABNORMAL ATTEMPTED TO ANSWER QUESTIONS PT WILL PROBABLY HAVE QUESTIONS AT NEXT OFFICE VISIT PT SEEMED SATISFIED WITH ANSWERS   WILL FORWARD TO DR Elease HashimotoNAHSER FOR REVIEW .Zack Seal/CY

## 2017-07-27 NOTE — Telephone Encounter (Signed)
Non specific ST abn are very common in patients with CAD This is of no concern

## 2017-07-27 NOTE — Telephone Encounter (Signed)
New Message  Pt states he has some questions for RN about upcoming procedure. Please call back to discuss

## 2017-07-30 NOTE — Telephone Encounter (Signed)
Left voice mail of Dr Harvie BridgeNahser's response .Zack Seal/cy

## 2018-01-04 ENCOUNTER — Other Ambulatory Visit: Payer: Self-pay | Admitting: Cardiovascular Disease

## 2018-01-19 ENCOUNTER — Other Ambulatory Visit: Payer: Self-pay | Admitting: Cardiovascular Disease

## 2018-01-24 ENCOUNTER — Other Ambulatory Visit: Payer: Self-pay

## 2018-01-24 ENCOUNTER — Emergency Department (HOSPITAL_COMMUNITY)
Admission: EM | Admit: 2018-01-24 | Discharge: 2018-01-24 | Disposition: A | Discharge status: Home / Self Care | Payer: Medicare Other | Attending: Emergency Medicine | Admitting: Emergency Medicine

## 2018-01-24 ENCOUNTER — Encounter (HOSPITAL_COMMUNITY): Payer: Self-pay | Admitting: Emergency Medicine

## 2018-01-24 DIAGNOSIS — K5900 Constipation, unspecified: Secondary | ICD-10-CM | POA: Diagnosis not present

## 2018-01-24 DIAGNOSIS — I119 Hypertensive heart disease without heart failure: Secondary | ICD-10-CM | POA: Diagnosis not present

## 2018-01-24 DIAGNOSIS — Z79899 Other long term (current) drug therapy: Secondary | ICD-10-CM | POA: Insufficient documentation

## 2018-01-24 DIAGNOSIS — Z7982 Long term (current) use of aspirin: Secondary | ICD-10-CM | POA: Insufficient documentation

## 2018-01-24 DIAGNOSIS — I2510 Atherosclerotic heart disease of native coronary artery without angina pectoris: Secondary | ICD-10-CM | POA: Diagnosis not present

## 2018-01-24 DIAGNOSIS — R301 Vesical tenesmus: Secondary | ICD-10-CM | POA: Insufficient documentation

## 2018-01-24 DIAGNOSIS — R198 Other specified symptoms and signs involving the digestive system and abdomen: Secondary | ICD-10-CM

## 2018-01-24 LAB — COMPREHENSIVE METABOLIC PANEL
ALK PHOS: 68 U/L (ref 38–126)
ALT: 17 U/L (ref 17–63)
AST: 34 U/L (ref 15–41)
Albumin: 4.2 g/dL (ref 3.5–5.0)
Anion gap: 13 (ref 5–15)
BUN: 21 mg/dL — ABNORMAL HIGH (ref 6–20)
CALCIUM: 9.5 mg/dL (ref 8.9–10.3)
CO2: 24 mmol/L (ref 22–32)
CREATININE: 1.31 mg/dL — AB (ref 0.61–1.24)
Chloride: 105 mmol/L (ref 101–111)
GFR calc Af Amer: >60 mL/min (ref >60)
GFR calc non Af Amer: 55 mL/min — ABNORMAL LOW (ref >60)
GLUCOSE: 88 mg/dL (ref 65–99)
Potassium: 4.7 mmol/L (ref 3.5–5.1)
Sodium: 142 mmol/L (ref 135–145)
TOTAL PROTEIN: 7.5 g/dL (ref 6.5–8.1)
Total Bilirubin: 1.1 mg/dL (ref 0.3–1.2)

## 2018-01-24 LAB — CBC
HEMATOCRIT: 45.1 % (ref 39.0–52.0)
HEMOGLOBIN: 15.1 g/dL (ref 13.0–17.0)
MCH: 31.9 pg (ref 26.0–34.0)
MCHC: 33.5 g/dL (ref 30.0–36.0)
MCV: 95.1 fL (ref 78.0–100.0)
Platelets: 198 10*3/uL (ref 150–400)
RBC: 4.74 MIL/uL (ref 4.22–5.81)
RDW: 11.8 % (ref 11.5–15.5)
WBC: 8.8 10*3/uL (ref 4.0–10.5)

## 2018-01-24 LAB — LIPASE, BLOOD: Lipase: 31 U/L (ref 11–51)

## 2018-01-24 MED ORDER — PRAMOXINE HCL 1 % RE FOAM: 1.0000 "application " | Freq: Three times a day (TID) | RECTAL | 0 refills | Status: DC | PRN

## 2018-01-24 MED ORDER — LIDOCAINE HCL URETHRAL/MUCOSAL 2 % EX GEL
1.0000 "application " | Freq: Once | CUTANEOUS | Status: AC
  Administered 2018-01-24: 1 via TOPICAL
  Filled 2018-01-24: qty 20

## 2018-01-24 MED ORDER — POLYETHYLENE GLYCOL 3350 17 G PO PACK: 17.0000 g | PACK | Freq: Every day | ORAL | 0 refills | Status: DC

## 2018-01-24 NOTE — ED Triage Notes (Addendum)
w as constipated and took some meds  And went a  Lot and saw his dr yesterday given creams for his bottom and now he feels he is constipated again but had liquid stool  This am states no blood except from hemmoroids

## 2018-01-24 NOTE — ED Provider Notes (Signed)
[facility] EMERGENCY DEPARTMENT Provider Note   CSN: [id number] Arrival date & time: 01/24/18  1338     History   Chief Complaint Chief Complaint  Patient presents with  . Constipation  . Hemorrhoids  . Abdominal Pain    HPI Darrell Wagner is a 67 y.o. male.  The history is provided by the patient. No language interpreter was used.  Constipation   Associated symptoms include abdominal pain.  Abdominal Pain   Associated symptoms include constipation.   Darrell Wagner is a 67 y.o. male who presents to the Emergency Department complaining of constipation, rectal pain. Constipation that began on Monday. He took milk of magnesia at home and had a large bowel movement. For the bowel movement he had rectal pain. Since that time he has had decreased oral intake due to fear of having another bowel movement and having more pain. On Tuesday he tried an over-the-counter rectal suppository for hemorrhoids. On Wednesday he saw his PCP and was started on a topical hydrocortisone cream and then took milk of magnesia again. Since that time he has persistent sensation like he needs to have a bowel movement but is unable to have one. He does have significant rectal pain when attempting to have a bowel movement. He denies any fevers, abdominal pain, nausea, vomiting. He does have a known history of hemorrhoids. He has had prior colonoscopy last with several years ago. Symptoms are moderate in waxing and waning. In the emergency department he has no pain. Past Medical History:  Diagnosis Date  . Arthritis   . Coronary artery disease   . GERD (gastroesophageal reflux disease)   . History of hiatal hernia   . Hypertension   . Myocardial infarct [facility]) 2010    Patient Active Problem List   Diagnosis Date Noted  . Hyperlipidemia 04/15/2015  . Precordial pain 03/27/2013  . GERD (gastroesophageal reflux disease) 03/27/2013  . Left inguinal hernia 08/22/2012  . CAD (coronary artery  disease) 09/07/2011    Past Surgical History:  Procedure Laterality Date  . CORONARY ARTERY BYPASS GRAFT  2010   x4  . INGUINAL HERNIA REPAIR Left 07/31/2014   Procedure: LAPAROSCOPIC INGUINAL HERNIA WITH MESH;  Surgeon: Atilano Ina, MD;  Location: [facility];  Service: General;  Laterality: Left;  . INSERTION OF MESH N/A 07/31/2014   Procedure: INSERTION OF MESH;  Surgeon: Atilano Ina, MD;  Location: [facility];  Service: General;  Laterality: N/A;  . TIBIA FRACTURE SURGERY    . TONSILLECTOMY          Home Medications    Prior to Admission medications   Medication Sig Start Date End Date Taking? Authorizing Provider  aspirin 81 MG tablet Take 81 mg by mouth at bedtime.    Yes [provider]  magnesium hydroxide (MILK OF MAGNESIA) 400 MG/5ML suspension Take 45 mLs by mouth daily as needed for mild constipation.   Yes [provider]  metoprolol tartrate (LOPRESSOR) 25 MG tablet Take 1 tablet (25 mg total) 2 (two) times daily by mouth. 07/17/17  Yes Nahser, Deloris Ping, MD  niacin (NIASPAN) 1000 MG CR tablet TAKE ONE TABLET BY MOUTH NIGHTLY AT BEDTIME 01/21/18  Yes Nahser, Deloris Ping, MD  PROCTOSOL HC 2.5 % rectal cream Apply 1 application topically as needed. 01/23/18  Yes [provider]  ranitidine (ZANTAC) 150 MG tablet Take 150 mg by mouth at bedtime.    Yes [provider]  rosuvastatin (CRESTOR) 10 MG  tablet TAKE 1 TABLET (10 MG TOTAL) BY MOUTH DAILY. Patient taking differently: Take 10 mg by mouth at bedtime.  01/04/18  Yes Nahser, Deloris Ping, MD  tamsulosin (FLOMAX) 0.4 MG CAPS capsule Take 0.4 mg by mouth at bedtime.    Yes [provider]  FLUZONE HIGH-DOSE 0.5 ML SUSY Inject as directed once. 07/30/15   [provider]  polyethylene glycol (MIRALAX / GLYCOLAX) packet Take 17 g by mouth daily. 01/24/18   Tilden Fossa, MD  pramoxine (PROCTOFOAM) 1 % foam Place 1 application rectally 3 (three) times daily as needed for anal itching.  01/24/18   Tilden Fossa, MD    Family History Family History  Problem Relation Age of Onset  . Hypertension Mother   . Hypertension Sister     Social History Social History   Tobacco Use  . Smoking status: Never Smoker  . Smokeless tobacco: Never Used  Substance Use Topics  . Alcohol use: No  . Drug use: No     Allergies   Sulfa drugs cross reactors   Review of Systems Review of Systems  Gastrointestinal: Positive for abdominal pain and constipation.  All other systems reviewed and are negative.    Physical Exam Updated Vital Signs BP 131/78 (BP Location: Right Arm)   Pulse 77   Temp 98 F (36.7 C) (Oral)   Resp 18   Ht  (1.778 m)   Wt 74.8 kg (165 lb)   SpO2 100%   BMI 23.68 kg/m   Physical Exam  Constitutional: He is oriented to person, place, and time. He appears well-developed and well-nourished.  HENT:  Head: Normocephalic and atraumatic.  Cardiovascular: Normal rate and regular rhythm.  No murmur heard. Pulmonary/Chest: Effort normal and breath sounds normal. No respiratory distress.  Abdominal: Soft. There is no tenderness. There is no rebound and no guarding.  Genitourinary:  Genitourinary Comments: Nontender, non-thrombosis external hemorrhoids. Pinpoint area of ecchymosis on one of his hemorrhoids. He does have no rectal tenderness but has palpable internal hemorrhoids. No concerning features for Perirectal abscess.  Musculoskeletal: He exhibits no edema or tenderness.  Neurological: He is alert and oriented to person, place, and time.  Skin: Skin is warm and dry.  Psychiatric: He has a normal mood and affect. His behavior is normal.  Nursing note and vitals reviewed.    ED Treatments / Results  Labs (all labs ordered are listed, but only abnormal results are displayed) Labs Reviewed  COMPREHENSIVE METABOLIC PANEL - Abnormal; Notable for the following components:      Result Value   BUN 21 (*)    Creatinine, Ser 1.31 (*)    GFR  calc non Af Amer 55 (*)    All other components within normal limits  LIPASE, BLOOD  CBC    EKG None  Radiology No results found.  Procedures Procedures (including critical care time)  Medications Ordered in ED Medications  lidocaine (XYLOCAINE) 2 % jelly 1 application (1 application Topical Given 01/24/18 2053)     Initial Impression / Assessment and Plan / ED Course  I have reviewed the triage vital signs and the nursing notes.  Pertinent labs & imaging results that were available during my care of the patient were reviewed by me and considered in my medical decision making (see chart for details).     Patient is here for evaluation of rectal pain after treating for constipation. No fecal impaction on examination or evidence of perirectal abscess. He has no significant abdominal  tenderness. Discussed with patient home care for rectal pain/in this mess as well as treating his ongoing constipation. Discussed outpatient follow-up and return precautions.  Final Clinical Impressions(s) / ED Diagnoses   Final diagnoses:  Constipation, unspecified constipation type  Tenesmus    ED Discharge Orders        Ordered    pramoxine (PROCTOFOAM) 1 % foam  3 times daily PRN     01/24/18 2036    polyethylene glycol (MIRALAX / GLYCOLAX) packet  Daily     01/24/18 2036       Tilden Fossa, MD 01/25/18 [phone]

## 2018-05-02 ENCOUNTER — Other Ambulatory Visit: Payer: Medicare Other | Admitting: *Deleted

## 2018-05-02 DIAGNOSIS — I2510 Atherosclerotic heart disease of native coronary artery without angina pectoris: Secondary | ICD-10-CM

## 2018-05-02 DIAGNOSIS — E782 Mixed hyperlipidemia: Secondary | ICD-10-CM

## 2018-05-02 LAB — BASIC METABOLIC PANEL
BUN/Creatinine Ratio: 16 (ref 10–24)
BUN: 17 mg/dL (ref 8–27)
CALCIUM: 9.2 mg/dL (ref 8.6–10.2)
CO2: 23 mmol/L (ref 20–29)
CREATININE: 1.08 mg/dL (ref 0.76–1.27)
Chloride: 105 mmol/L (ref 96–106)
GFR calc Af Amer: 82 mL/min/{1.73_m2} (ref >59)
GFR, EST NON AFRICAN AMERICAN: 71 mL/min/{1.73_m2} (ref >59)
GLUCOSE: 99 mg/dL (ref 65–99)
Potassium: 4.2 mmol/L (ref 3.5–5.2)
SODIUM: 142 mmol/L (ref 134–144)

## 2018-05-02 LAB — LIPID PANEL
CHOL/HDL RATIO: 2.4 ratio (ref 0.0–5.0)
Cholesterol, Total: 98 mg/dL — ABNORMAL LOW (ref 100–199)
HDL: 41 mg/dL (ref >39)
LDL Calculated: 46 mg/dL (ref 0–99)
TRIGLYCERIDES: 57 mg/dL (ref 0–149)
VLDL Cholesterol Cal: 11 mg/dL (ref 5–40)

## 2018-05-02 LAB — HEPATIC FUNCTION PANEL
ALBUMIN: 3.8 g/dL (ref 3.6–4.8)
ALK PHOS: 82 IU/L (ref 39–117)
ALT: 14 IU/L (ref 0–44)
AST: 22 IU/L (ref 0–40)
BILIRUBIN TOTAL: 0.4 mg/dL (ref 0.0–1.2)
BILIRUBIN, DIRECT: 0.16 mg/dL (ref 0.00–0.40)
TOTAL PROTEIN: 6.2 g/dL (ref 6.0–8.5)

## 2018-05-09 ENCOUNTER — Encounter

## 2018-05-09 ENCOUNTER — Ambulatory Visit: Payer: Medicare Other | Admitting: Cardiovascular Disease

## 2018-05-09 ENCOUNTER — Encounter: Payer: Self-pay | Admitting: Cardiovascular Disease

## 2018-05-09 VITALS — BP 118/72 | HR 66 | Ht 66.0 in | Wt 168.0 lb

## 2018-05-09 DIAGNOSIS — E782 Mixed hyperlipidemia: Secondary | ICD-10-CM | POA: Diagnosis not present

## 2018-05-09 DIAGNOSIS — I2510 Atherosclerotic heart disease of native coronary artery without angina pectoris: Secondary | ICD-10-CM | POA: Diagnosis not present

## 2018-05-09 NOTE — Patient Instructions (Signed)
Medication Instructions:  Your physician recommends that you continue on your current medications as directed. Please refer to the Current Medication list given to you today.   Labwork: None Ordered   Testing/Procedures: None Ordered   Follow-Up: Your physician wants you to follow-up in: 6 months with Scott Weaver, PA or another member of Dr. Nahser's team.  You will receive a reminder letter in the mail two months in advance. If you don't receive a letter, please call our office to schedule the follow-up appointment.   If you need a refill on your cardiac medications before your next appointment, please call your pharmacy.   Thank you for choosing [facility]! Michelle Swinyer, RN [phone]    

## 2018-05-09 NOTE — Progress Notes (Signed)
Darrell Wagner Date of Birth  09/18/1950       [facility]    [place] [facility] [address]  [address], [address] [place], [place]  [address]   [place], [place]  [id number] [phone]     [phone]   Fax  [phone]    Fax [phone]  Problem List: 1. 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 Transient atrial fibrillation-   3. Hyperlipidemia - lipids are well    Darrell Wagner is a 67 year old gentleman with a history of coronary artery disease-status post CABG. He also had transient atrial fibrillation. [name] done well since I last saw him. [name] not having episodes of chest pain or shortness of breath.  He has done well. [name] done very well since I last saw him. Not had any episodes of chest pain or shortness of breath.  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  He is feeling better now that it's summer.    October 10, 2012: [name] is doing very well from a cardiac standpoint. [name] not having any episodes of angina. He denies any palpitations.  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  April 24, 2013:  Darrell Wagner continues to do well. He was admitted to the hospital for chest pain  in mid July. He had a stress Myoview study which was normal. His ejection fraction is 67%.  He has had some intermittant episodes of "gas pain " .  He has noticed improvement with protonix.    Dec. 23, 2014:  No cardiac issues.  [name] not had any cardiac issues. He is not exercising as much as he thinks he should. He is not having at that any episodes of pain doing his normal activities.  March 20, 2014:  He has occasional episodes of heartburn. He knows that  eats too late at night.  Exercising some - rides his bike occasionally.    April 15, 2015:  Darrell Wagner is doing well.   Jan. 31 2017:  Darrell Wagner is doing well Some back issues, no cardiac issues.  Exercises some .  Walks 2-3 miles a day  Labs were reviewed.   Aug. 18,  2017:  Darrell Wagner is seen today for follow-up of his coronary artery disease and coronary artery bypass grafting. Labs are good Creatinine is slightly higher   November 10, 2016:  Doing well.  No CP or dyspnea.   Lipids from Feb. 28 look great .   Nov. 6, 2018:     Followed for CAD - CABG in 2010 , , hyperlipidemia .  No CP or dyspnea.   BP is well controlled Walks and rides a bike regularly   May 09, 2018: He was seen back today for follow-up of his coronary artery disease.  He has a history of coronary artery bypass grafting in 2010.Darrell Wagner.  He has a history of hyperlipidemia.  Walks regularly   lipids look great.   Current Outpatient Medications on File Prior to Visit  Medication Sig Dispense Refill  . aspirin 81 MG tablet Take 81 mg by mouth at bedtime.     Darrell Wagner. FLUZONE HIGH-DOSE 0.5 ML SUSY Inject as directed once.  0  . magnesium hydroxide (MILK OF MAGNESIA) 400 MG/5ML suspension Take 45 mLs by mouth daily as needed for mild constipation.    . metoprolol tartrate (LOPRESSOR) 25 MG tablet Take 1 tablet (25 mg total) 2 (two) times daily by  mouth. 180 tablet 3  . niacin (NIASPAN) 1000 MG CR tablet TAKE ONE TABLET BY MOUTH NIGHTLY AT BEDTIME 90 tablet 1  . polyethylene glycol (MIRALAX / GLYCOLAX) packet Take 17 g by mouth daily. 14 each 0  . pramoxine (PROCTOFOAM) 1 % foam Place 1 application rectally 3 (three) times daily as needed for anal itching. 15 g 0  . PROCTOSOL HC 2.5 % rectal cream Apply 1 application topically as needed.    . ranitidine (ZANTAC) 150 MG tablet Take 150 mg by mouth at bedtime.     . rosuvastatin (CRESTOR) 10 MG tablet TAKE 1 TABLET (10 MG TOTAL) BY MOUTH DAILY. (Patient taking differently: Take 10 mg by mouth at bedtime. ) 30 tablet 5  . tamsulosin (FLOMAX) 0.4 MG CAPS capsule Take 0.4 mg by mouth at bedtime.      No current facility-administered medications on file prior to visit.     Allergies  Allergen Reactions  . Sulfa Drugs Cross Reactors Other (See  Comments)    unknown    Past Medical History:  Diagnosis Date  . Arthritis   . Coronary artery disease   . GERD (gastroesophageal reflux disease)   . History of hiatal hernia   . Hypertension   . Myocardial infarct [facility]) 2010    Past Surgical History:  Procedure Laterality Date  . CORONARY ARTERY BYPASS GRAFT  2010   x4  . INGUINAL HERNIA REPAIR Left 07/31/2014   Procedure: LAPAROSCOPIC INGUINAL HERNIA WITH MESH;  Surgeon: Atilano Ina, MD;  Location: [facility];  Service: General;  Laterality: Left;  . INSERTION OF MESH N/A 07/31/2014   Procedure: INSERTION OF MESH;  Surgeon: Atilano Ina, MD;  Location: [facility];  Service: General;  Laterality: N/A;  . TIBIA FRACTURE SURGERY    . TONSILLECTOMY      Social History   Tobacco Use  Smoking Status Never Smoker  Smokeless Tobacco Never Used    Social History   Substance and Sexual Activity  Alcohol Use No    Family History  Problem Relation Age of Onset  . Hypertension Mother   . Hypertension Sister     Reviw of Systems:  Noted in current history, otherwise review of systems is negative.Darrell Wagner  Physical Exam: Blood pressure 118/72, pulse 66, height 5\' 6"  (1.676 m), weight 168 lb (76.2 kg), SpO2 98 %.  GEN:  Well nourished, well developed in no acute distress HEENT: Normal NECK: No JVD; No carotid bruits LYMPHATICS: No lymphadenopathy CARDIAC: RR, no murmurs, rubs, gallops RESPIRATORY:  Clear to auscultation without rales, wheezing or rhonchi  ABDOMEN: Soft, non-tender, non-distended MUSCULOSKELETAL:  No edema; No deformity  SKIN: Warm and dry NEUROLOGIC:  Alert and oriented x 3  ECG:   Aug. 29, 2019:   NSR with 1st degree AV block HR is 66.  ( on metoprolol)   Assessment / Plan:   1. Coronary artery disease-status post CABG- July, 2010  no  Angina .  2. Transient atrial fibrillation -  No recurrent AF   3. Hyperlipidemia -     recent labs look great.  Continue current medications.   Kristeen Miss, MD   05/09/2018 9:58 AM    [facility] [address] [place],  [address] [place], [place]  [address] Pager [phone] Phone: [phone]; Fax: [phone]

## 2018-07-03 ENCOUNTER — Other Ambulatory Visit: Payer: Self-pay | Admitting: Cardiovascular Disease

## 2018-07-05 ENCOUNTER — Telehealth: Payer: Self-pay | Admitting: Cardiovascular Disease

## 2018-07-05 DIAGNOSIS — I739 Peripheral vascular disease, unspecified: Secondary | ICD-10-CM

## 2018-07-05 NOTE — Telephone Encounter (Signed)
New message   Patient states that he is having leg cramps and leg pain up and down both legs and numbness in his feet. Patient states that this started about 3 to 4 weeks ago. Please call to discuss.

## 2018-07-05 NOTE — Telephone Encounter (Signed)
Spoke to patient who is calling because within the last 3-4 weeks his legs started bothering him with mild aching during the day and leading to cramping and numbness in the feet by evening.  No new medications started.  He has no CP, swelling or weight gain.  He occasionally experiences SOB.  He was wondering if there was a study that could be done to check blood flow in his legs and check for PAD.  Please advise, thank you.

## 2018-07-07 NOTE — Telephone Encounter (Signed)
Will send for screening ABIs

## 2018-07-08 NOTE — Telephone Encounter (Signed)
Left message for patient that LE arterial study has been ordered and that someone from our office will call him to schedule. I advised him to call back with questions or concerns.

## 2018-07-18 ENCOUNTER — Other Ambulatory Visit: Payer: Self-pay | Admitting: Cardiovascular Disease

## 2018-07-18 DIAGNOSIS — I739 Peripheral vascular disease, unspecified: Secondary | ICD-10-CM

## 2018-07-25 ENCOUNTER — Ambulatory Visit (HOSPITAL_COMMUNITY)
Admission: RE | Admit: 2018-07-25 | Discharge: 2018-07-25 | Disposition: A | Discharge status: Home / Self Care | Payer: Medicare Other | Source: Ambulatory Visit | Attending: Cardiology | Admitting: Cardiology

## 2018-07-25 DIAGNOSIS — I739 Peripheral vascular disease, unspecified: Secondary | ICD-10-CM | POA: Diagnosis not present

## 2018-07-26 ENCOUNTER — Telehealth: Payer: Self-pay | Admitting: Cardiovascular Disease

## 2018-07-26 NOTE — Telephone Encounter (Signed)
New message ° ° °Patient is returning call for test results. °

## 2018-07-26 NOTE — Telephone Encounter (Signed)
Reviewed results with patient who has reviewed results online and has question about the left toe index abnormality mentioned. He states he has pain in his left tow. I advised I will forward question to reading physician, Dr. Delton SeeNelson and call patient back with her advice. He thanked me for the call.

## 2018-07-26 NOTE — Telephone Encounter (Signed)
-----   Message from Vesta MixerPhilip J Nahser, MD sent at 07/25/2018  5:26 PM EST ----- No evidence of PVD

## 2018-07-29 NOTE — Telephone Encounter (Signed)
Notes recorded by Lars MassonNelson, Katarina H, MD on 07/27/2018 at 8:12 AM EST Resting left ankle-brachial index indicates normal, triphasic flow patterns. The left toe-brachial index is mildy abnormal. At the foot/toe level we cant intervene as the vessels are too small, therapy is medical - like quit smoking, lots of exercise/walking, aggressive lipid, blood pressure management.  Reviewed Dr. Lindaann SloughNelson's advice with patient who verbalized understanding. He states he does not smoke. He thanked me for the call.

## 2018-08-20 ENCOUNTER — Other Ambulatory Visit: Payer: Self-pay | Admitting: Cardiovascular Disease

## 2018-09-06 ENCOUNTER — Other Ambulatory Visit: Payer: Self-pay | Admitting: Cardiovascular Disease

## 2018-11-18 ENCOUNTER — Other Ambulatory Visit: Payer: Self-pay | Admitting: Physician Assistant

## 2018-11-18 DIAGNOSIS — R1032 Left lower quadrant pain: Secondary | ICD-10-CM

## 2018-11-18 DIAGNOSIS — R634 Abnormal weight loss: Secondary | ICD-10-CM

## 2018-11-18 DIAGNOSIS — R140 Abdominal distension (gaseous): Secondary | ICD-10-CM

## 2018-11-22 ENCOUNTER — Ambulatory Visit
Admission: RE | Admit: 2018-11-22 | Discharge: 2018-11-22 | Disposition: A | Discharge status: Home / Self Care | Payer: Medicare Other | Source: Ambulatory Visit | Attending: Physician Assistant | Admitting: Physician Assistant

## 2018-11-22 DIAGNOSIS — R634 Abnormal weight loss: Secondary | ICD-10-CM

## 2018-11-22 DIAGNOSIS — R1032 Left lower quadrant pain: Secondary | ICD-10-CM

## 2018-11-22 DIAGNOSIS — R140 Abdominal distension (gaseous): Secondary | ICD-10-CM

## 2018-11-22 MED ORDER — IOPAMIDOL (ISOVUE-300) INJECTION 61%
100.0000 mL | Freq: Once | INTRAVENOUS | Status: AC | PRN
  Administered 2018-11-22: 100 mL via INTRAVENOUS

## 2019-03-23 ENCOUNTER — Other Ambulatory Visit: Payer: Self-pay | Admitting: Cardiovascular Disease

## 2019-06-14 ENCOUNTER — Other Ambulatory Visit: Payer: Self-pay | Admitting: Cardiovascular Disease

## 2019-06-16 ENCOUNTER — Telehealth: Payer: Self-pay | Admitting: Cardiovascular Disease

## 2019-06-16 DIAGNOSIS — I2510 Atherosclerotic heart disease of native coronary artery without angina pectoris: Secondary | ICD-10-CM

## 2019-06-16 DIAGNOSIS — E782 Mixed hyperlipidemia: Secondary | ICD-10-CM

## 2019-06-16 NOTE — Telephone Encounter (Signed)
New Message:     Pt is scheduled to see Dr Acie Fredrickson on 07-21-19. Pt wants to know if he need lab work, he says he usually have it before his appt with Dr Acie Fredrickson. I told pt you would call and let him know if he need to have lab work and order it.

## 2019-06-17 NOTE — Telephone Encounter (Signed)
Lab work ordered and appointment scheduled a few days prior to patient's appointment with Dr. Acie Fredrickson. Message sent through Fate.

## 2019-07-09 ENCOUNTER — Other Ambulatory Visit: Payer: Self-pay | Admitting: Cardiovascular Disease

## 2019-07-17 ENCOUNTER — Other Ambulatory Visit: Payer: Self-pay

## 2019-07-17 ENCOUNTER — Other Ambulatory Visit: Payer: Medicare Other | Admitting: *Deleted

## 2019-07-17 DIAGNOSIS — I2510 Atherosclerotic heart disease of native coronary artery without angina pectoris: Secondary | ICD-10-CM

## 2019-07-17 DIAGNOSIS — E782 Mixed hyperlipidemia: Secondary | ICD-10-CM

## 2019-07-18 LAB — HEPATIC FUNCTION PANEL
ALT: 13 IU/L (ref 0–44)
AST: 20 IU/L (ref 0–40)
Albumin: 4.3 g/dL (ref 3.8–4.8)
Alkaline Phosphatase: 92 IU/L (ref 39–117)
Bilirubin Total: 0.7 mg/dL (ref 0.0–1.2)
Bilirubin, Direct: 0.18 mg/dL (ref 0.00–0.40)
Total Protein: 7 g/dL (ref 6.0–8.5)

## 2019-07-18 LAB — BASIC METABOLIC PANEL
BUN/Creatinine Ratio: 13 (ref 10–24)
BUN: 15 mg/dL (ref 8–27)
CO2: 24 mmol/L (ref 20–29)
Calcium: 9.8 mg/dL (ref 8.6–10.2)
Chloride: 105 mmol/L (ref 96–106)
Creatinine, Ser: 1.17 mg/dL (ref 0.76–1.27)
GFR calc Af Amer: 73 mL/min/{1.73_m2} (ref >59)
GFR calc non Af Amer: 63 mL/min/{1.73_m2} (ref >59)
Glucose: 92 mg/dL (ref 65–99)
Potassium: 4.2 mmol/L (ref 3.5–5.2)
Sodium: 142 mmol/L (ref 134–144)

## 2019-07-18 LAB — LIPID PANEL
Chol/HDL Ratio: 2.3 ratio (ref 0.0–5.0)
Cholesterol, Total: 116 mg/dL (ref 100–199)
HDL: 51 mg/dL (ref >39)
LDL Chol Calc (NIH): 50 mg/dL (ref 0–99)
Triglycerides: 74 mg/dL (ref 0–149)
VLDL Cholesterol Cal: 15 mg/dL (ref 5–40)

## 2019-07-20 NOTE — Progress Notes (Signed)
Darrell Wagner Date of Birth  03/10/1951       [facility] Office [address] [place], [place], [place]  [address]   [place], [place]  [address] [phone]     [phone]   Fax  [phone]    Fax [phone]  Problem List: 1. 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 Transient atrial fibrillation-   3. Hyperlipidemia - lipids are well    Darrell Wagner is a 68 year old gentleman with a history of coronary artery disease-status post CABG. He also had transient atrial fibrillation. [name] done well since I last saw him. [name] not having episodes of chest pain or shortness of breath.  He has done well. [name] done very well since I last saw him. Not had any episodes of chest pain or shortness of breath.  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  He is feeling better now that it's summer.    October 10, 2012: Darrell Wagner is doing very well from a cardiac standpoint. [name] not having any episodes of angina. He denies any palpitations.  He is a Music therapist at [employer].  April 24, 2013:  Darrell Wagner continues to do well. He was admitted to the hospital for chest pain  in mid July. He had a stress Myoview study which was normal. His ejection fraction is 67%.  He has had some intermittant episodes of "gas pain " .  He has noticed improvement with protonix.    Dec. 23, 2014:  No cardiac issues.  [name] not had any cardiac issues. He is not exercising as much as he thinks he should. He is not having at that any episodes of pain doing his normal activities.  March 20, 2014:  He has occasional episodes of heartburn. He knows that  eats too late at night.  Exercising some - rides his bike occasionally.    April 15, 2015:  Darrell Wagner is doing well.   Jan. 31 2017:  Darrell Wagner is doing well Some back issues, no cardiac issues.  Exercises some .  Walks 2-3 miles a day  Labs were reviewed.   Aug. 18,  2017:  Darrell Wagner is seen today for follow-up of his coronary artery disease and coronary artery bypass grafting. Labs are good Creatinine is slightly higher   November 10, 2016:  Doing well.  No CP or dyspnea.   Lipids from Feb. 28 look great .   Nov. 6, 2018:     Followed for CAD - CABG in 2010 , , hyperlipidemia .  No CP or dyspnea.   BP is well controlled Walks and rides a bike regularly   May 09, 2018: He was seen back today for follow-up of his coronary artery disease.  He has a history of coronary artery bypass grafting in 2010.[name]  He has a history of hyperlipidemia.  Walks regularly   lipids look great.   Nov. 9, 2020   Darrell Wagner is seen today for follow up of his CAD and HLD  Walks regularly ,  Staying at home more   Had some left leg pain .   He had a normal lower extremity arterial duplex scan.  He does comment that he has some back pain and thinks that his leg pain may be originating from his back.    Current Outpatient Medications on File Prior to Visit  Medication Sig Dispense Refill  .  aspirin 81 MG tablet Take 81 mg by mouth at bedtime.     [name] FLUZONE HIGH-DOSE 0.5 ML SUSY Inject as directed once.  0  . magnesium hydroxide (MILK OF MAGNESIA) 400 MG/5ML suspension Take 45 mLs by mouth daily as needed for mild constipation.    . metoprolol tartrate (LOPRESSOR) 25 MG tablet TAKE 1 TABLET BY MOUTH 2 TIMES DAILY. NEED TO CALL AND SCHEDULE AN APPOINTMENT FOR FURTHER REFILLS 60 tablet 0  . niacin (NIASPAN) 1000 MG CR tablet TAKE ONE TABLET BY MOUTH NIGHTLY AT BEDTIME 90 tablet 2  . polyethylene glycol (MIRALAX / GLYCOLAX) packet Take 17 g by mouth daily. 14 each 0  . pramoxine (PROCTOFOAM) 1 % foam Place 1 application rectally 3 (three) times daily as needed for anal itching. 15 g 0  . PROCTOSOL HC 2.5 % rectal cream Apply 1 application topically as needed.    . ranitidine (ZANTAC) 150 MG tablet Take 150 mg by mouth at bedtime.     . rosuvastatin (CRESTOR) 10 MG tablet TAKE 1  TABLET BY MOUTH EVERY DAY 30 tablet 0  . tamsulosin (FLOMAX) 0.4 MG CAPS capsule Take 0.4 mg by mouth at bedtime.      No current facility-administered medications on file prior to visit.     Allergies  Allergen Reactions  . Sulfa Drugs Cross Reactors Other (See Comments)    unknown    Past Medical History:  Diagnosis Date  . Arthritis   . Coronary artery disease   . GERD (gastroesophageal reflux disease)   . History of hiatal hernia   . Hypertension   . Myocardial infarct [facility]) 2010    Past Surgical History:  Procedure Laterality Date  . CORONARY ARTERY BYPASS GRAFT  2010   x4  . INGUINAL HERNIA REPAIR Left 07/31/2014   Procedure: LAPAROSCOPIC INGUINAL HERNIA WITH MESH;  Surgeon: Atilano Ina, MD;  Location: [facility];  Service: General;  Laterality: Left;  . INSERTION OF MESH N/A 07/31/2014   Procedure: INSERTION OF MESH;  Surgeon: Atilano Ina, MD;  Location: [facility];  Service: General;  Laterality: N/A;  . TIBIA FRACTURE SURGERY    . TONSILLECTOMY      Social History   Tobacco Use  Smoking Status Never Smoker  Smokeless Tobacco Never Used    Social History   Substance and Sexual Activity  Alcohol Use No    Family History  Problem Relation Age of Onset  . Hypertension Mother   . Hypertension Sister     Reviw of Systems:  Noted in current history, otherwise review of systems is negative.[name]  Physical Exam: There were no vitals taken for this visit.  GEN:  Well nourished, well developed in no acute distress HEENT: Normal NECK: No JVD; No carotid bruits LYMPHATICS: No lymphadenopathy CARDIAC: RRR , no murmurs, rubs, gallops RESPIRATORY:  Clear to auscultation without rales, wheezing or rhonchi  ABDOMEN: Soft, non-tender, non-distended MUSCULOSKELETAL:  No edema; No deformity  SKIN: Warm and dry NEUROLOGIC:  Alert and oriented x 3   ECG: July 21, 2019: Normal sinus rhythm at 63 beats a minute.  First-degree AV block.  Otherwise normal  EKG.  Assessment / Plan:   1. Coronary artery disease-status post CABG- July, 2010  no angina .   Cont rosuvastatin   2. Transient atrial fibrillation -   No evidence of recurrent AFifb since surgery   3. Hyperlipidemia -   Labs from last week were reviewed.  Labs look great  Kristeen MissPhilip Corynn Solberg, MD  07/20/2019 7:34 PM    [facility] [address],  [address] [place], [place]  [address] Pager [phone] Phone: [phone]; Fax: [phone]

## 2019-07-21 ENCOUNTER — Other Ambulatory Visit: Payer: Self-pay

## 2019-07-21 ENCOUNTER — Encounter: Payer: Self-pay | Admitting: Cardiovascular Disease

## 2019-07-21 ENCOUNTER — Ambulatory Visit: Payer: Medicare Other | Admitting: Cardiovascular Disease

## 2019-07-21 VITALS — BP 104/70 | HR 63 | Ht 69.0 in | Wt 153.1 lb

## 2019-07-21 DIAGNOSIS — E782 Mixed hyperlipidemia: Secondary | ICD-10-CM

## 2019-07-21 DIAGNOSIS — I2510 Atherosclerotic heart disease of native coronary artery without angina pectoris: Secondary | ICD-10-CM | POA: Diagnosis not present

## 2019-07-21 MED ORDER — METOPROLOL TARTRATE 25 MG PO TABS: ORAL_TABLET | ORAL | 3 refills | Status: DC

## 2019-07-21 MED ORDER — NIACIN ER (ANTIHYPERLIPIDEMIC) 1000 MG PO TBCR: 1000.0000 mg | EXTENDED_RELEASE_TABLET | Freq: Every day | ORAL | 3 refills | Status: DC

## 2019-07-21 MED ORDER — ROSUVASTATIN CALCIUM 10 MG PO TABS: 10.0000 mg | ORAL_TABLET | Freq: Every day | ORAL | 3 refills | Status: DC

## 2019-07-21 NOTE — Patient Instructions (Signed)
Medication Instructions:  Your physician recommends that you continue on your current medications as directed. Please refer to the Current Medication list given to you today.  *If you need a refill on your cardiac medications before your next appointment, please call your pharmacy*  Lab Work: Your physician recommends that you return for lab work in: 12 months on the day of or a few days before your office visit with Dr. Acie Fredrickson.  You will need to FAST for this appointment - nothing to eat or drink after midnight the night before except water.    Testing/Procedures: None Ordered   Follow-Up: At [facility], you and your health needs are our priority.  As part of our continuing mission to provide you with exceptional heart care, we have created designated Provider Care Teams.  These Care Teams include your primary Cardiologist (physician) and Advanced Practice Providers (APPs -  Physician Assistants and Nurse Practitioners) who all work together to provide you with the care you need, when you need it.  Your next appointment:   12 months  The format for your next appointment:   In Person  Provider:   You may see Mertie Moores, MD or one of the following Advanced Practice Providers on your designated Care Team:    Richardson Dopp, PA-C  [place], [place]  Daune Perch, [place]

## 2019-10-06 ENCOUNTER — Ambulatory Visit: Payer: Medicare Other

## 2019-10-23 ENCOUNTER — Ambulatory Visit: Payer: Self-pay

## 2019-10-30 ENCOUNTER — Telehealth: Payer: Self-pay

## 2019-10-30 NOTE — Telephone Encounter (Signed)
Pt called to see what medications he can take after getting the second vaccine. He had is last one yesterday in [place]. Will have nurse call him back.   Joycelyn Rua Hopkins

## 2019-10-30 NOTE — Telephone Encounter (Signed)
Patient had injection yesterday- he wants to know if he can take his prescribed medications now. Advised patient it is recommended not to premedication before the vaccine- but certainly he needs to take his prescribed medication as directed. Patient states he had los grade fever last night- but is doing much better now. Patient voices understanding.

## 2020-06-05 ENCOUNTER — Other Ambulatory Visit: Payer: Self-pay | Admitting: Cardiovascular Disease

## 2020-06-08 ENCOUNTER — Other Ambulatory Visit: Payer: Self-pay

## 2020-06-08 MED ORDER — ROSUVASTATIN CALCIUM 10 MG PO TABS: 10.0000 mg | ORAL_TABLET | Freq: Every day | ORAL | 0 refills | Status: DC

## 2020-06-08 MED ORDER — METOPROLOL TARTRATE 25 MG PO TABS: ORAL_TABLET | ORAL | 0 refills | Status: DC

## 2020-06-18 ENCOUNTER — Telehealth: Payer: Self-pay | Admitting: Cardiovascular Disease

## 2020-06-18 NOTE — Telephone Encounter (Signed)
    Pt would like to speak with Dr. Harvie Bridge nurse he said he have question about BP

## 2020-06-18 NOTE — Telephone Encounter (Signed)
I spoke with patient and gave him information from Dr Elease Hashimoto

## 2020-06-18 NOTE — Telephone Encounter (Signed)
I returned call and spoke with patient's wife.  Patient is not available at this time and should be back around 11.  Will call him back after 11

## 2020-06-18 NOTE — Telephone Encounter (Signed)
Patient called back and reported he took lopressor later than normal today and about 2 hours after taking his BP was 106/66 and heart rate was 56.  He is very concerned about readings.  I told him these were OK but he could decrease dose of lopressor to 12.5 mg twice daily for now until we heard from Dr Elease Hashimoto.  I told him to hold dose if BP less than 100 or heart rate less than 60 prior to taking.

## 2020-06-18 NOTE — Telephone Encounter (Signed)
Agree with Note by Charlotte Crumb, RN   I think he can even hold his metoprolol completely ( if he continues to have pre-syncope symptoms )  and continue to measure his HR and BP . We can review his BP/ HR log when I seen him in November.

## 2020-06-18 NOTE — Telephone Encounter (Signed)
Patient returned call, transferred to nurse

## 2020-06-18 NOTE — Telephone Encounter (Signed)
I spoke with patient. He reports he was at wellness visit about 10 days ago. After having blood drawn he went to check out and felt dizzy and lightheaded. BP was checked and it was 102/50.  Blood sugar was 86.  After drinking orange juice and sitting he felt better and BP went back up.  Patient has been concerned about BP since then. He bought a BP cuff and checked 4 times yesterday. Readings were 125/77,114/69,125/71 and 117/71.  Heart rate in the 60's. Patient reports he lost about 30 lbs over the summer due to stomach issues.  Cause of issues was not determined and he has since gained about 15 lbs back. He is not having any dizziness except on occasion when he leans over or stands up.  This has been going on for a long time. Patient is concerned medications may need to be changed.  He takes lopressor 25 mg twice daily.  I told patient episode at doctor's office was probably related to not eating and having blood drawn.  I let him know readings at last visits with Dr Elease Hashimoto were similar or lower to what he has been getting at home.  I advised him to check BP/heart rate prior to taking AM medications and then 2 hours after and bring readings to upcoming appointment in November.  I told him to make sure he stays hydrated and change positions slowly. I told him I would make Dr Elease Hashimoto aware and we would call him back if any changes needed prior to upcoming appointment

## 2020-07-16 ENCOUNTER — Other Ambulatory Visit: Payer: Self-pay

## 2020-07-19 ENCOUNTER — Other Ambulatory Visit: Payer: Self-pay

## 2020-07-19 ENCOUNTER — Other Ambulatory Visit: Payer: Medicare PPO | Admitting: *Deleted

## 2020-07-19 DIAGNOSIS — I2510 Atherosclerotic heart disease of native coronary artery without angina pectoris: Secondary | ICD-10-CM

## 2020-07-19 DIAGNOSIS — E782 Mixed hyperlipidemia: Secondary | ICD-10-CM | POA: Diagnosis not present

## 2020-07-19 LAB — BASIC METABOLIC PANEL
BUN/Creatinine Ratio: 13 (ref 10–24)
BUN: 14 mg/dL (ref 8–27)
CO2: 24 mmol/L (ref 20–29)
Calcium: 9.5 mg/dL (ref 8.6–10.2)
Chloride: 105 mmol/L (ref 96–106)
Creatinine, Ser: 1.05 mg/dL (ref 0.76–1.27)
GFR calc Af Amer: 83 mL/min/{1.73_m2} (ref >59)
GFR calc non Af Amer: 72 mL/min/{1.73_m2} (ref >59)
Glucose: 100 mg/dL — ABNORMAL HIGH (ref 65–99)
Potassium: 4.2 mmol/L (ref 3.5–5.2)
Sodium: 142 mmol/L (ref 134–144)

## 2020-07-19 LAB — HEPATIC FUNCTION PANEL
ALT: 12 IU/L (ref 0–44)
AST: 22 IU/L (ref 0–40)
Albumin: 4.4 g/dL (ref 3.8–4.8)
Alkaline Phosphatase: 89 IU/L (ref 44–121)
Bilirubin Total: 0.7 mg/dL (ref 0.0–1.2)
Bilirubin, Direct: 0.21 mg/dL (ref 0.00–0.40)
Total Protein: 7 g/dL (ref 6.0–8.5)

## 2020-07-19 LAB — LIPID PANEL
Chol/HDL Ratio: 2.3 ratio (ref 0.0–5.0)
Cholesterol, Total: 118 mg/dL (ref 100–199)
HDL: 52 mg/dL (ref >39)
LDL Chol Calc (NIH): 53 mg/dL (ref 0–99)
Triglycerides: 57 mg/dL (ref 0–149)
VLDL Cholesterol Cal: 13 mg/dL (ref 5–40)

## 2020-07-20 ENCOUNTER — Ambulatory Visit: Payer: Self-pay | Admitting: Cardiovascular Disease

## 2020-07-27 ENCOUNTER — Encounter: Payer: Self-pay | Admitting: Cardiovascular Disease

## 2020-07-27 ENCOUNTER — Ambulatory Visit: Payer: Medicare PPO | Admitting: Cardiovascular Disease

## 2020-07-27 ENCOUNTER — Other Ambulatory Visit: Payer: Self-pay

## 2020-07-27 VITALS — BP 104/70 | HR 87 | Ht 69.0 in | Wt 143.0 lb

## 2020-07-27 DIAGNOSIS — Z79899 Other long term (current) drug therapy: Secondary | ICD-10-CM | POA: Diagnosis not present

## 2020-07-27 DIAGNOSIS — E782 Mixed hyperlipidemia: Secondary | ICD-10-CM | POA: Diagnosis not present

## 2020-07-27 DIAGNOSIS — I2510 Atherosclerotic heart disease of native coronary artery without angina pectoris: Secondary | ICD-10-CM

## 2020-07-27 MED ORDER — ROSUVASTATIN CALCIUM 10 MG PO TABS
10.0000 mg | ORAL_TABLET | Freq: Every day | ORAL | 3 refills | Status: DC
Start: 2020-07-27 — End: 2021-03-17

## 2020-07-27 MED ORDER — NIACIN ER (ANTIHYPERLIPIDEMIC) 1000 MG PO TBCR
1000.0000 mg | EXTENDED_RELEASE_TABLET | Freq: Every day | ORAL | 3 refills | Status: DC
Start: 2020-07-27 — End: 2021-08-10

## 2020-07-27 NOTE — Progress Notes (Signed)
Darrell Wagner Date of Birth  01/06/1951       [facility] Office [address], [place], [place]  [address]   [place], [place]  [address] [phone]     [phone]   Fax  [phone]    Fax [phone]  Problem List: 1. 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 Transient atrial fibrillation-   3. Hyperlipidemia - lipids are well    Darrell Wagner is a 69 year old gentleman with a history of coronary artery disease-status post CABG. He also had transient atrial fibrillation. [name] done well since I last saw him. [name] not having episodes of chest pain or shortness of breath.  He has done well. [name] done very well since I last saw him. Not had any episodes of chest pain or shortness of breath.  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  He is feeling better now that it's summer.    October 10, 2012: Darrell Wagner is doing very well from a cardiac standpoint. [name] not having any episodes of angina. He denies any palpitations.  He is a Music therapist at [employer].  April 24, 2013:  Darrell Wagner continues to do well. He was admitted to the hospital for chest pain  in mid July. He had a stress Myoview study which was normal. His ejection fraction is 67%.  He has had some intermittant episodes of "gas pain " .  He has noticed improvement with protonix.    Dec. 23, 2014:  No cardiac issues.  [name] not had any cardiac issues. He is not exercising as much as he thinks he should. He is not having at that any episodes of pain doing his normal activities.  March 20, 2014:  He has occasional episodes of heartburn. He knows that  eats too late at night.  Exercising some - rides his bike occasionally.    April 15, 2015:  Darrell Wagner is doing well.   Jan. 31 2017:  Darrell Wagner is doing well Some back issues, no cardiac issues.  Exercises some .  Walks 2-3 miles a day  Labs were reviewed.   Aug. 18,  2017:  Darrell Wagner is seen today for follow-up of his coronary artery disease and coronary artery bypass grafting. Labs are good Creatinine is slightly higher   November 10, 2016:  Doing well.  No CP or dyspnea.   Lipids from Feb. 28 look great .   Nov. 6, 2018:     Followed for CAD - CABG in 2010 , , hyperlipidemia .  No CP or dyspnea.   BP is well controlled Walks and rides a bike regularly   May 09, 2018: He was seen back today for follow-up of his coronary artery disease.  He has a history of coronary artery bypass grafting in 2010.[name]  He has a history of hyperlipidemia.  Walks regularly   lipids look great.   Nov. 9, 2020   Darrell Wagner is seen today for follow up of his CAD and HLD  Walks regularly ,  Staying at home more   Had some left leg pain .   He had a normal lower extremity arterial duplex scan.  He does comment that he has some back pain and thinks that his leg pain may be originating from his back.   July 27, 2020: Darrell Wagner is seen today for follow-up of his coronary artery disease and  hyperlipidemia. He had an episode of near syncope while at his primary medical doctor's office.  The episode occurred right after he had some blood drawn.  When he called our office we reduce the dose of his metoprolol  He has been monitoring his BP   Has had some stomach issues for the past year.  His GI doctors have not been able to find any diagnosis He has lost 25-30 lbs.  Regained 10 lbs of that    Current Outpatient Medications on File Prior to Visit  Medication Sig Dispense Refill  . aspirin 81 MG tablet Take 81 mg by mouth at bedtime.     . fluticasone (FLONASE) 50 MCG/ACT nasal spray Place 1 spray into the nose daily as needed.    . magnesium hydroxide (MILK OF MAGNESIA) 400 MG/5ML suspension Take 45 mLs by mouth daily as needed for mild constipation.    . metoprolol tartrate (LOPRESSOR) 25 MG tablet Take 12.5 mg by mouth 2 (two) times daily.    . niacin (NIASPAN) 1000 MG CR tablet  Take 1 tablet (1,000 mg total) by mouth at bedtime. 90 tablet 3  . PROCTOSOL HC 2.5 % rectal cream Apply 1 application topically as needed.    . rosuvastatin (CRESTOR) 10 MG tablet Take 1 tablet (10 mg total) by mouth daily. Please keep upcoming appt in November with Dr. Elease Hashimoto before anymore refills. Thank you 90 tablet 0  . tamsulosin (FLOMAX) 0.4 MG CAPS capsule Take 0.4 mg by mouth at bedtime.      No current facility-administered medications on file prior to visit.    Allergies  Allergen Reactions  . Sulfa Drugs Cross Reactors Other (See Comments)    unknown    Past Medical History:  Diagnosis Date  . Arthritis   . Coronary artery disease   . GERD (gastroesophageal reflux disease)   . History of hiatal hernia   . Hypertension   . Myocardial infarct [facility]) 2010    Past Surgical History:  Procedure Laterality Date  . CORONARY ARTERY BYPASS GRAFT  2010   x4  . INGUINAL HERNIA REPAIR Left 07/31/2014   Procedure: LAPAROSCOPIC INGUINAL HERNIA WITH MESH;  Surgeon: Atilano Ina, MD;  Location: [facility];  Service: General;  Laterality: Left;  . INSERTION OF MESH N/A 07/31/2014   Procedure: INSERTION OF MESH;  Surgeon: Atilano Ina, MD;  Location: [facility];  Service: General;  Laterality: N/A;  . TIBIA FRACTURE SURGERY    . TONSILLECTOMY      Social History   Tobacco Use  Smoking Status Never Smoker  Smokeless Tobacco Never Used    Social History   Substance and Sexual Activity  Alcohol Use No    Family History  Problem Relation Age of Onset  . Hypertension Mother   . Hypertension Sister     Reviw of Systems:  Noted in current history, otherwise review of systems is negative.[name]  Physical Exam: Blood pressure 104/70, pulse 87, height 5\' 9"  (1.753 m), weight 143 lb (64.9 kg), SpO2 99 %.  GEN:  Well nourished, well developed in no acute distress HEENT: Normal NECK: No JVD; No carotid bruits LYMPHATICS: No lymphadenopathy CARDIAC: RRR , soft 1-2/6 systolic murmur   RESPIRATORY:  Clear to auscultation without rales, wheezing or rhonchi  ABDOMEN: Soft, non-tender, non-distended MUSCULOSKELETAL:  No edema; No deformity  SKIN: Warm and dry NEUROLOGIC:  Alert and oriented x 3   ECG:    Nov. 16, 2021:  NSR at 87.  NS ST /T  Abn.    Assessment / Plan:   1. Coronary artery disease-status post CABG- July, 2010 Stable ,  Labs    2. Transient atrial fibrillation -      3. Hyperlipidemia -    Recent labs look great   4.  Hypotension:   Needs to eat more proteins.  We discussed adding electrolyte tablets to his water.  We also discussed possibly having him drink a V8 juice every day.    Kristeen Miss, MD  07/27/2020 10:26 AM    [facility] [address] [place],  [address] [place], [place]  [address] Pager [phone] Phone: [phone]; Fax: [phone]

## 2020-07-27 NOTE — Patient Instructions (Addendum)
Increase your intake of fluids (water with electrolyte tabs like Nuun tablets, or gatorade) , protein ( hard boiled eggs, chicken, fish) , and a electrolytes ( V-8 juice, salt, potassium chloride  which is sold as No-Salt Medication Instructions:  Your physician recommends that you continue on your current medications as directed. Please refer to the Current Medication list given to you today.  *If you need a refill on your cardiac medications before your next appointment, please call your pharmacy*   Lab Work: none If you have labs (blood work) drawn today and your tests are completely normal, you will receive your results only by: [name] MyChart Message (if you have MyChart) OR . A paper copy in the mail If you have any lab test that is abnormal or we need to change your treatment, we will call you to review the results.   Testing/Procedures: none   Follow-Up: At [facility], you and your health needs are our priority.  As part of our continuing mission to provide you with exceptional heart care, we have created designated Provider Care Teams.  These Care Teams include your primary Cardiologist (physician) and Advanced Practice Providers (APPs -  Physician Assistants and Nurse Practitioners) who all work together to provide you with the care you need, when you need it.  We recommend signing up for the patient portal called "MyChart".  Sign up information is provided on this After Visit Summary.  MyChart is used to connect with patients for Virtual Visits (Telemedicine).  Patients are able to view lab/test results, encounter notes, upcoming appointments, etc.  Non-urgent messages can be sent to your provider as well.   To learn more about what you can do with MyChart, go to [email or web address].    Your next appointment:   1 year(s)  The format for your next appointment:   In Person  Provider:   Kristeen Miss, MD   Other Instructions

## 2020-08-28 IMAGING — CT CT ABDOMEN AND PELVIS WITH CONTRAST
2 of 5 series · 16 of 46 positions shown, 18 images · IV contrast (iopamidol)
Comparison: None.

CLINICAL DATA: Left lower quadrant pain and bloating for 6 weeks.
Weight loss.

EXAM:
CT ABDOMEN AND PELVIS WITH CONTRAST
TECHNIQUE: Multidetector CT imaging of the abdomen and pelvis was performed
using the standard protocol following bolus administration of
intravenous contrast.
CONTRAST:  100mL 9CJIW9-OOO IOPAMIDOL (9CJIW9-OOO) INJECTION 61%

[Series 2: abd pelvis 5.00 br40 s3 ax · axial · 0.69mm/px · z∈[+1175,+1615]mm · 13 of 98 slices shown, 15 images]
[im 5/98  soft-tissue]
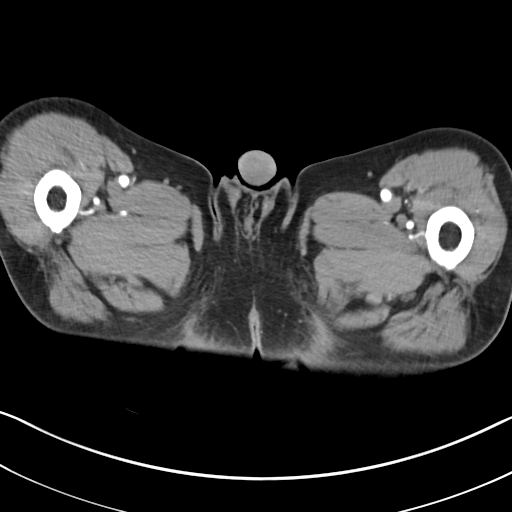
[im 5/98  bone]
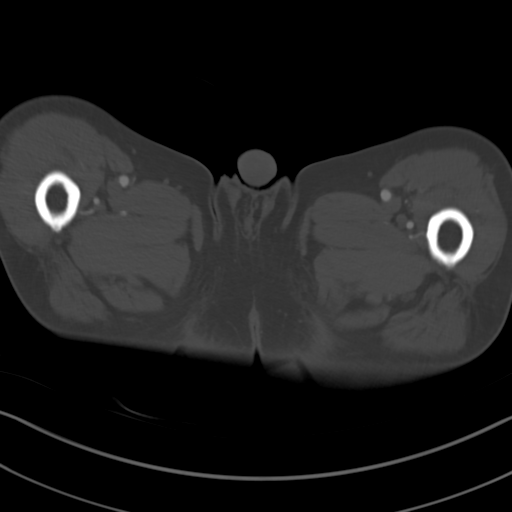
[im 15/98  soft-tissue]
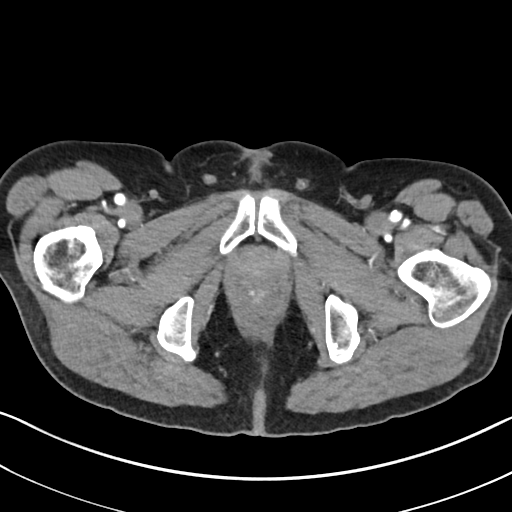
[im 20/98  soft-tissue]
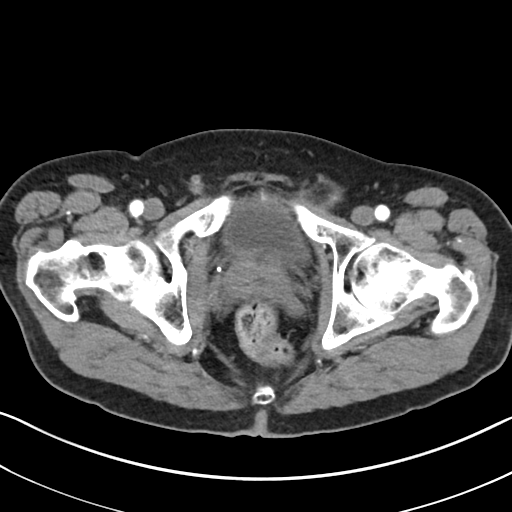
[im 30/98  soft-tissue]
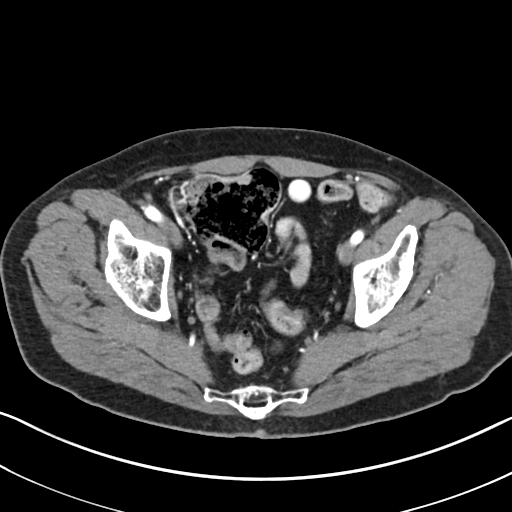
[im 34/98  soft-tissue]
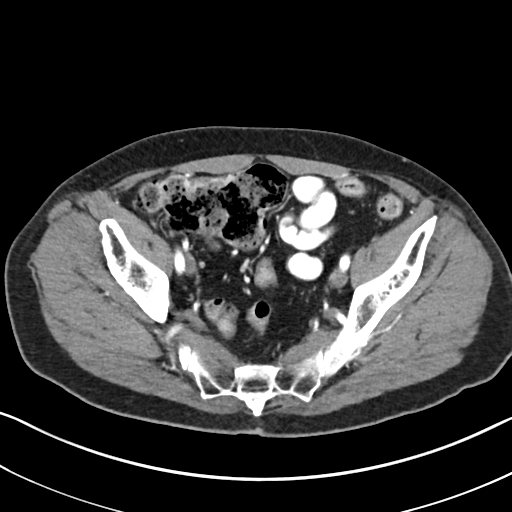
[im 44/98  soft-tissue]
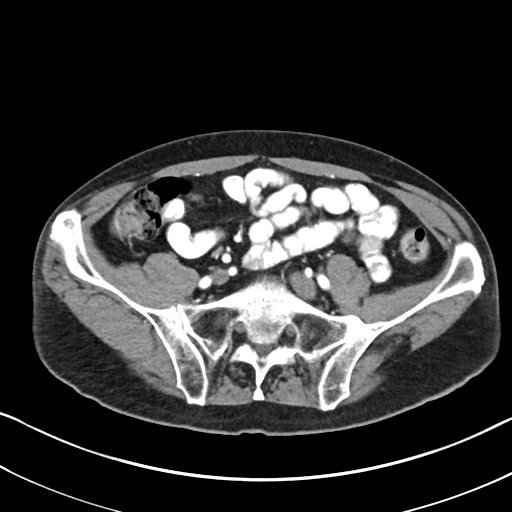
[im 49/98  soft-tissue]
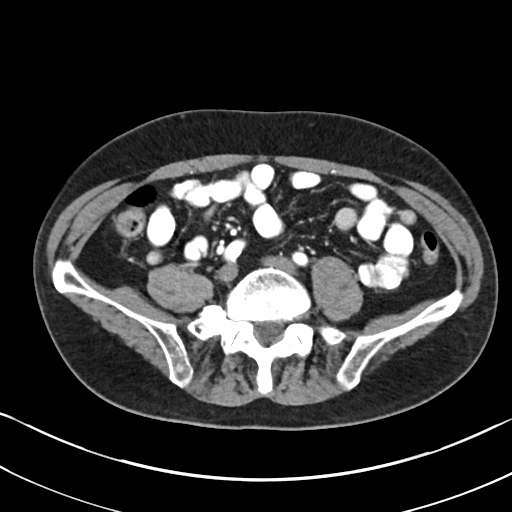
[im 54/98  soft-tissue]
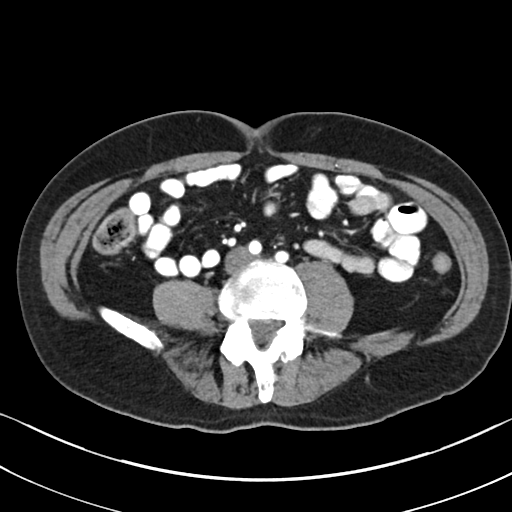
[im 64/98  soft-tissue]
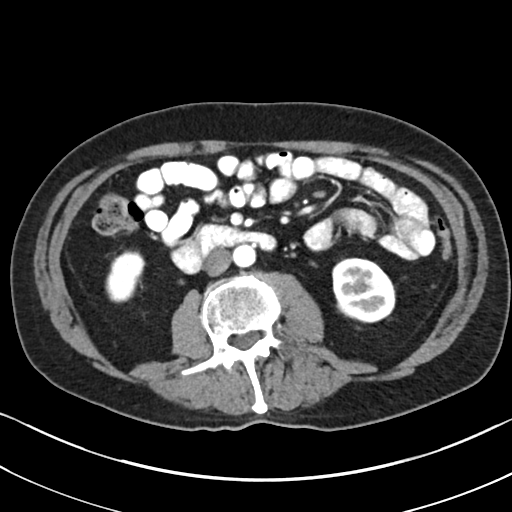
[im 64/98  bone]
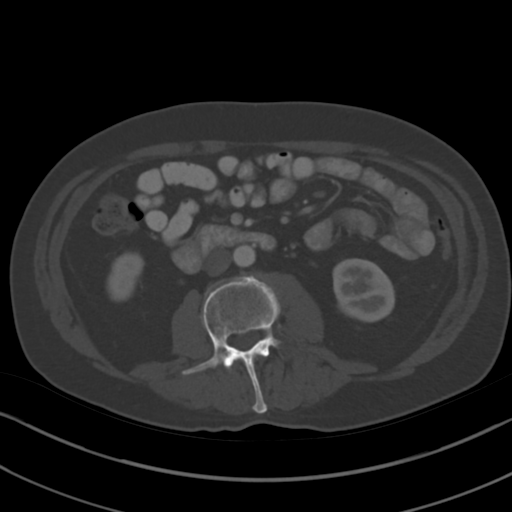
[im 68/98  soft-tissue]
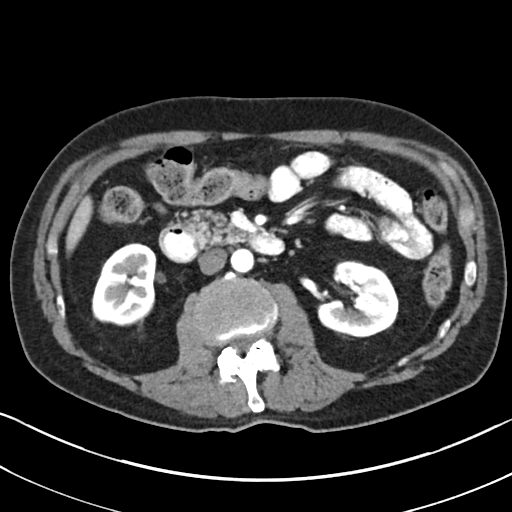
[im 78/98  soft-tissue]
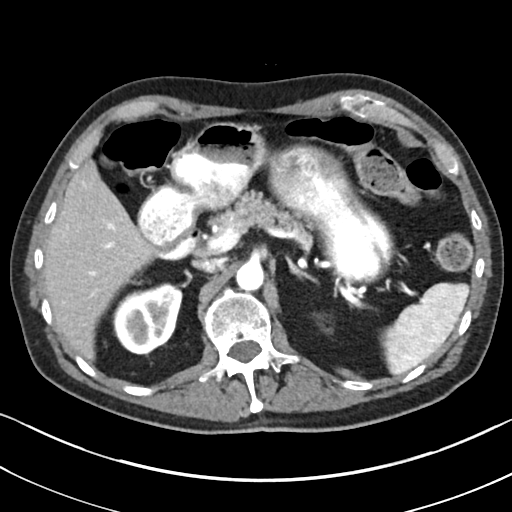
[im 83/98  soft-tissue]
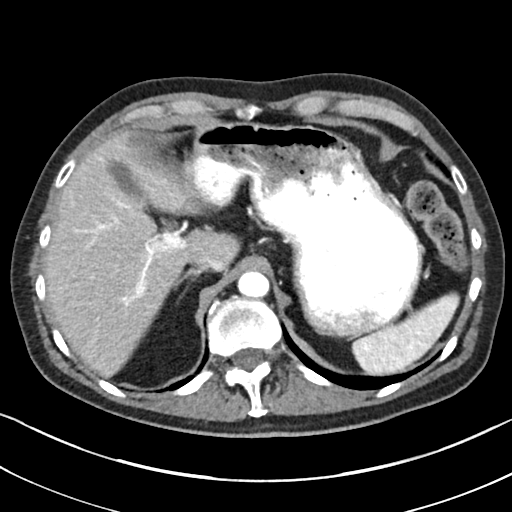
[im 93/98  soft-tissue]
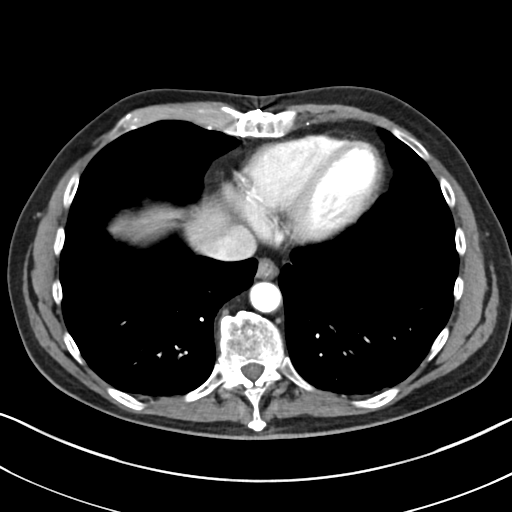

[Series 6: abd pelvis 2.00 br40 s3 cor · coronal · 0.69mm/px · 3 of 137 slices shown]
[im 46/137  soft-tissue]
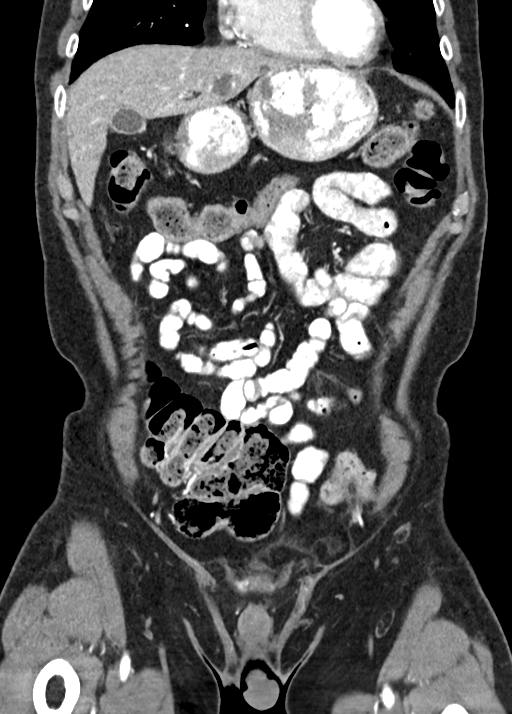
[im 61/137  soft-tissue]
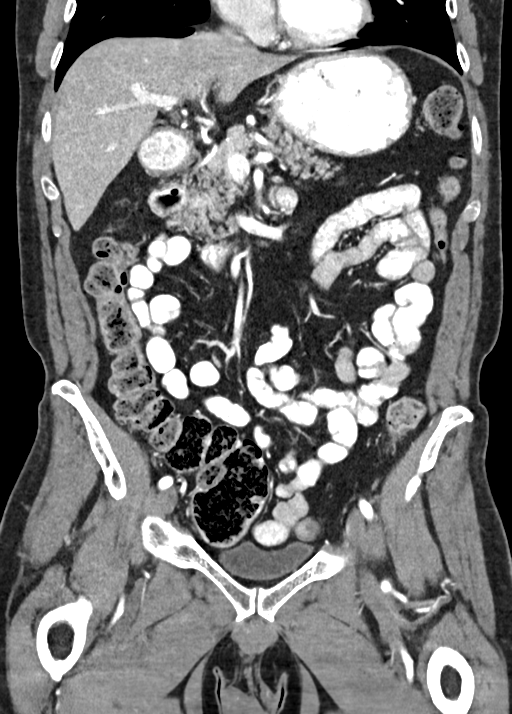
[im 76/137  soft-tissue]
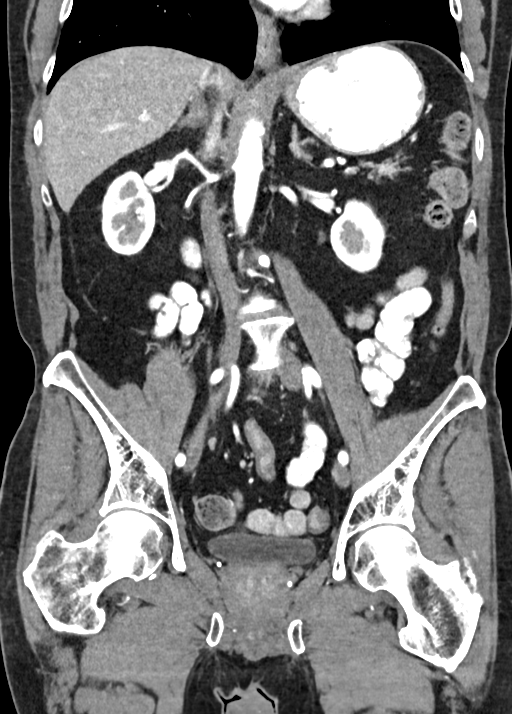

[16 of 46 positions shown; findings below may reference images not displayed]

FINDINGS: Lower Chest: No acute findings.

Hepatobiliary: No hepatic masses identified. Two small cysts are
noted in the left hepatic lobe. Gallbladder is unremarkable.

Pancreas:  No mass or inflammatory changes.

Spleen: Within normal limits in size and appearance.

Adrenals/Urinary Tract: No masses identified. No evidence of
hydronephrosis.

Stomach/Bowel: No evidence of obstruction, inflammatory process or
abnormal fluid collections.

Vascular/Lymphatic: No pathologically enlarged lymph nodes. No
abdominal aortic aneurysm.

Reproductive:  No mass or other significant abnormality.

Other:  None.

Musculoskeletal:  No suspicious bone lesions identified.
IMPRESSION: No acute findings or other significant abnormality5.

## 2020-08-31 ENCOUNTER — Other Ambulatory Visit: Payer: Self-pay | Admitting: Cardiovascular Disease

## 2020-09-14 DIAGNOSIS — K219 Gastro-esophageal reflux disease without esophagitis: Secondary | ICD-10-CM | POA: Diagnosis not present

## 2020-10-10 DIAGNOSIS — H43812 Vitreous degeneration, left eye: Secondary | ICD-10-CM | POA: Diagnosis not present

## 2020-11-05 DIAGNOSIS — H527 Unspecified disorder of refraction: Secondary | ICD-10-CM | POA: Diagnosis not present

## 2020-11-05 DIAGNOSIS — H25813 Combined forms of age-related cataract, bilateral: Secondary | ICD-10-CM | POA: Diagnosis not present

## 2020-11-05 DIAGNOSIS — H43812 Vitreous degeneration, left eye: Secondary | ICD-10-CM | POA: Diagnosis not present

## 2020-12-30 DIAGNOSIS — K219 Gastro-esophageal reflux disease without esophagitis: Secondary | ICD-10-CM | POA: Diagnosis not present

## 2021-01-03 DIAGNOSIS — H527 Unspecified disorder of refraction: Secondary | ICD-10-CM | POA: Diagnosis not present

## 2021-01-03 DIAGNOSIS — H43812 Vitreous degeneration, left eye: Secondary | ICD-10-CM | POA: Diagnosis not present

## 2021-01-03 DIAGNOSIS — H25813 Combined forms of age-related cataract, bilateral: Secondary | ICD-10-CM | POA: Diagnosis not present

## 2021-02-04 DIAGNOSIS — R12 Heartburn: Secondary | ICD-10-CM | POA: Diagnosis not present

## 2021-02-04 DIAGNOSIS — I2510 Atherosclerotic heart disease of native coronary artery without angina pectoris: Secondary | ICD-10-CM | POA: Diagnosis not present

## 2021-02-04 DIAGNOSIS — K259 Gastric ulcer, unspecified as acute or chronic, without hemorrhage or perforation: Secondary | ICD-10-CM | POA: Diagnosis not present

## 2021-02-04 DIAGNOSIS — E785 Hyperlipidemia, unspecified: Secondary | ICD-10-CM | POA: Diagnosis not present

## 2021-02-04 DIAGNOSIS — Z79899 Other long term (current) drug therapy: Secondary | ICD-10-CM | POA: Diagnosis not present

## 2021-02-04 DIAGNOSIS — K2980 Duodenitis without bleeding: Secondary | ICD-10-CM | POA: Diagnosis not present

## 2021-02-04 DIAGNOSIS — K219 Gastro-esophageal reflux disease without esophagitis: Secondary | ICD-10-CM | POA: Diagnosis not present

## 2021-02-04 DIAGNOSIS — K3189 Other diseases of stomach and duodenum: Secondary | ICD-10-CM | POA: Diagnosis not present

## 2021-02-04 DIAGNOSIS — Z7982 Long term (current) use of aspirin: Secondary | ICD-10-CM | POA: Diagnosis not present

## 2021-02-08 DIAGNOSIS — K219 Gastro-esophageal reflux disease without esophagitis: Secondary | ICD-10-CM | POA: Diagnosis not present

## 2021-02-24 DIAGNOSIS — K219 Gastro-esophageal reflux disease without esophagitis: Secondary | ICD-10-CM | POA: Diagnosis not present

## 2021-02-25 DIAGNOSIS — H25813 Combined forms of age-related cataract, bilateral: Secondary | ICD-10-CM | POA: Diagnosis not present

## 2021-02-25 DIAGNOSIS — H0100B Unspecified blepharitis left eye, upper and lower eyelids: Secondary | ICD-10-CM | POA: Diagnosis not present

## 2021-02-25 DIAGNOSIS — H11122 Conjunctival concretions, left eye: Secondary | ICD-10-CM | POA: Diagnosis not present

## 2021-02-25 DIAGNOSIS — H0100A Unspecified blepharitis right eye, upper and lower eyelids: Secondary | ICD-10-CM | POA: Diagnosis not present

## 2021-02-25 DIAGNOSIS — H0102A Squamous blepharitis right eye, upper and lower eyelids: Secondary | ICD-10-CM | POA: Diagnosis not present

## 2021-02-25 DIAGNOSIS — H0102B Squamous blepharitis left eye, upper and lower eyelids: Secondary | ICD-10-CM | POA: Diagnosis not present

## 2021-03-16 ENCOUNTER — Other Ambulatory Visit: Payer: Self-pay | Admitting: Cardiovascular Disease

## 2021-04-15 DIAGNOSIS — H25813 Combined forms of age-related cataract, bilateral: Secondary | ICD-10-CM | POA: Diagnosis not present

## 2021-04-15 DIAGNOSIS — H43812 Vitreous degeneration, left eye: Secondary | ICD-10-CM | POA: Diagnosis not present

## 2021-04-15 DIAGNOSIS — H11122 Conjunctival concretions, left eye: Secondary | ICD-10-CM | POA: Diagnosis not present

## 2021-04-15 DIAGNOSIS — H0102A Squamous blepharitis right eye, upper and lower eyelids: Secondary | ICD-10-CM | POA: Diagnosis not present

## 2021-04-15 DIAGNOSIS — H527 Unspecified disorder of refraction: Secondary | ICD-10-CM | POA: Diagnosis not present

## 2021-04-15 DIAGNOSIS — H0102B Squamous blepharitis left eye, upper and lower eyelids: Secondary | ICD-10-CM | POA: Diagnosis not present

## 2021-05-13 ENCOUNTER — Telehealth: Payer: Self-pay | Admitting: Cardiovascular Disease

## 2021-05-13 DIAGNOSIS — Z79899 Other long term (current) drug therapy: Secondary | ICD-10-CM

## 2021-05-13 DIAGNOSIS — I2510 Atherosclerotic heart disease of native coronary artery without angina pectoris: Secondary | ICD-10-CM

## 2021-05-13 DIAGNOSIS — E782 Mixed hyperlipidemia: Secondary | ICD-10-CM

## 2021-05-13 NOTE — Telephone Encounter (Signed)
   Pt's wife said the pt gets lab work prior his routine f/u with Dr. Elease Hashimoto, no order on file

## 2021-06-02 DIAGNOSIS — H9190 Unspecified hearing loss, unspecified ear: Secondary | ICD-10-CM | POA: Diagnosis not present

## 2021-06-02 DIAGNOSIS — D696 Thrombocytopenia, unspecified: Secondary | ICD-10-CM | POA: Diagnosis not present

## 2021-06-02 DIAGNOSIS — E785 Hyperlipidemia, unspecified: Secondary | ICD-10-CM | POA: Diagnosis not present

## 2021-06-02 DIAGNOSIS — R1032 Left lower quadrant pain: Secondary | ICD-10-CM | POA: Diagnosis not present

## 2021-06-02 DIAGNOSIS — I10 Essential (primary) hypertension: Secondary | ICD-10-CM | POA: Diagnosis not present

## 2021-06-02 DIAGNOSIS — R3912 Poor urinary stream: Secondary | ICD-10-CM | POA: Diagnosis not present

## 2021-06-02 DIAGNOSIS — Z0000 Encounter for general adult medical examination without abnormal findings: Secondary | ICD-10-CM | POA: Diagnosis not present

## 2021-06-02 DIAGNOSIS — H6120 Impacted cerumen, unspecified ear: Secondary | ICD-10-CM | POA: Diagnosis not present

## 2021-06-09 ENCOUNTER — Other Ambulatory Visit: Payer: Self-pay | Admitting: Cardiovascular Disease

## 2021-08-10 ENCOUNTER — Other Ambulatory Visit: Payer: Self-pay | Admitting: Cardiovascular Disease

## 2021-08-11 ENCOUNTER — Other Ambulatory Visit: Payer: Medicare PPO

## 2021-08-11 ENCOUNTER — Other Ambulatory Visit: Payer: Self-pay

## 2021-08-11 DIAGNOSIS — E782 Mixed hyperlipidemia: Secondary | ICD-10-CM

## 2021-08-11 DIAGNOSIS — Z79899 Other long term (current) drug therapy: Secondary | ICD-10-CM | POA: Diagnosis not present

## 2021-08-11 DIAGNOSIS — I2510 Atherosclerotic heart disease of native coronary artery without angina pectoris: Secondary | ICD-10-CM | POA: Diagnosis not present

## 2021-08-11 LAB — HEPATIC FUNCTION PANEL
ALT: 12 IU/L (ref 0–44)
AST: 19 IU/L (ref 0–40)
Albumin: 4.4 g/dL (ref 3.7–4.7)
Alkaline Phosphatase: 83 IU/L (ref 44–121)
Bilirubin Total: 0.5 mg/dL (ref 0.0–1.2)
Bilirubin, Direct: 0.16 mg/dL (ref 0.00–0.40)
Total Protein: 7.2 g/dL (ref 6.0–8.5)

## 2021-08-11 LAB — BASIC METABOLIC PANEL
BUN/Creatinine Ratio: 13 (ref 10–24)
BUN: 14 mg/dL (ref 8–27)
CO2: 25 mmol/L (ref 20–29)
Calcium: 9.5 mg/dL (ref 8.6–10.2)
Chloride: 101 mmol/L (ref 96–106)
Creatinine, Ser: 1.1 mg/dL (ref 0.76–1.27)
Glucose: 82 mg/dL (ref 70–99)
Potassium: 4.2 mmol/L (ref 3.5–5.2)
Sodium: 139 mmol/L (ref 134–144)
eGFR: 72 mL/min/{1.73_m2} (ref >59)

## 2021-08-11 LAB — LIPID PANEL
Chol/HDL Ratio: 2.4 ratio (ref 0.0–5.0)
Cholesterol, Total: 129 mg/dL (ref 100–199)
HDL: 53 mg/dL (ref >39)
LDL Chol Calc (NIH): 63 mg/dL (ref 0–99)
Triglycerides: 59 mg/dL (ref 0–149)
VLDL Cholesterol Cal: 13 mg/dL (ref 5–40)

## 2021-08-14 NOTE — Progress Notes (Signed)
Darrell Wagner Date of Birth  01/06/1951       [facility] Office [address], [place], [place]  [address]   [place], [place]  [address] [phone]     [phone]   Fax  [phone]    Fax [phone]  Problem List: 1. 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 Transient atrial fibrillation-   3. Hyperlipidemia - lipids are well    Darrell Wagner is a 70 year old gentleman with a history of coronary artery disease-status post CABG. He also had transient atrial fibrillation. [name] done well since I last saw him. [name] not having episodes of chest pain or shortness of breath.  He has done well. [name] done very well since I last saw him. Not had any episodes of chest pain or shortness of breath.  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  He is feeling better now that it's summer.    October 10, 2012: Darrell Wagner is doing very well from a cardiac standpoint. [name] not having any episodes of angina. He denies any palpitations.  He is a Music therapist at [employer].  April 24, 2013:  Darrell Wagner continues to do well. He was admitted to the hospital for chest pain  in mid July. He had a stress Myoview study which was normal. His ejection fraction is 67%.  He has had some intermittant episodes of "gas pain " .  He has noticed improvement with protonix.    Dec. 23, 2014:  No cardiac issues.  [name] not had any cardiac issues. He is not exercising as much as he thinks he should. He is not having at that any episodes of pain doing his normal activities.  March 20, 2014:  He has occasional episodes of heartburn. He knows that  eats too late at night.  Exercising some - rides his bike occasionally.    April 15, 2015:  Darrell Wagner is doing well.   Jan. 31 2017:  Darrell Wagner is doing well Some back issues, no cardiac issues.  Exercises some .  Walks 2-3 miles a day  Labs were reviewed.   Aug. 18,  2017:  Darrell Wagner is seen today for follow-up of his coronary artery disease and coronary artery bypass grafting. Labs are good Creatinine is slightly higher   November 10, 2016:  Doing well.  No CP or dyspnea.   Lipids from Feb. 28 look great .   Nov. 6, 2018:     Followed for CAD - CABG in 2010 , , hyperlipidemia .  No CP or dyspnea.   BP is well controlled Walks and rides a bike regularly   May 09, 2018: He was seen back today for follow-up of his coronary artery disease.  He has a history of coronary artery bypass grafting in 2010.[name]  He has a history of hyperlipidemia.  Walks regularly   lipids look great.   Nov. 9, 2020   Darrell Wagner is seen today for follow up of his CAD and HLD  Walks regularly ,  Staying at home more   Had some left leg pain .   He had a normal lower extremity arterial duplex scan.  He does comment that he has some back pain and thinks that his leg pain may be originating from his back.   July 27, 2020: Darrell Wagner is seen today for follow-up of his coronary artery disease and  hyperlipidemia. He had an episode of near syncope while at his primary medical doctor's office.  The episode occurred right after he had some blood drawn.  When he called our office we reduce the dose of his metoprolol  He has been monitoring his BP   Has had some stomach issues for the past year.  His GI doctors have not been able to find any diagnosis He has lost 25-30 lbs.  Regained 10 lbs of that   Dec. 5, 2022:  Darrell Wagner is seen for follow up of his CAD , HLD  Very active out in the yard, No CP or dyspnea  Wt is 144 lbs.  Has occasional GERD symptoms .   Lipids from Dec. 1 , 2022 were reviewed  LDL looks good Up slightly from 50's to 63 recently - was eating some steak several days prior to his last blood draw    Current Outpatient Medications on File Prior to Visit  Medication Sig Dispense Refill   aspirin 81 MG tablet Take 81 mg by mouth at bedtime.      bifidobacterium infantis  (ALIGN) capsule Take 1 capsule by mouth every morning.     cetirizine (ZYRTEC) 10 MG tablet Take 10 mg by mouth daily as needed. For allergies     esomeprazole (NEXIUM) 40 MG capsule Take 40 mg by mouth daily.     famotidine (PEPCID) 40 MG tablet Take 1 tablet by mouth daily.     fluticasone (FLONASE) 50 MCG/ACT nasal spray Place 1 spray into the nose daily as needed.     metoprolol tartrate (LOPRESSOR) 25 MG tablet Take 0.5 tablets (12.5 mg total) by mouth 2 (two) times daily. 90 tablet 3   niacin (NIASPAN) 1000 MG CR tablet Take 1 tablet (1,000 mg total) by mouth at bedtime. Please keep upcoming appt in December 2022 with Dr. Elease Hashimoto before anymore refills. Thank you 90 tablet 0   rosuvastatin (CRESTOR) 10 MG tablet TAKE 1 TABLET BY MOUTH EVERY DAY 90 tablet 0   tamsulosin (FLOMAX) 0.4 MG CAPS capsule Take 0.4 mg by mouth at bedtime.      magnesium hydroxide (MILK OF MAGNESIA) 400 MG/5ML suspension Take 45 mLs by mouth daily as needed for mild constipation. (Patient not taking: Reported on 08/15/2021)     PROCTOSOL HC 2.5 % rectal cream Apply 1 application topically as needed. (Patient not taking: Reported on 08/15/2021)     No current facility-administered medications on file prior to visit.    Allergies  Allergen Reactions   Sulfa Drugs Cross Reactors Other (See Comments)    unknown    Past Medical History:  Diagnosis Date   Arthritis    Coronary artery disease    GERD (gastroesophageal reflux disease)    History of hiatal hernia    Hypertension    Myocardial infarct (HCC) 2010    Past Surgical History:  Procedure Laterality Date   CORONARY ARTERY BYPASS GRAFT  2010   x4   INGUINAL HERNIA REPAIR Left 07/31/2014   Procedure: LAPAROSCOPIC INGUINAL HERNIA WITH MESH;  Surgeon: Atilano Ina, MD;  Location: [facility];  Service: General;  Laterality: Left;   INSERTION OF MESH N/A 07/31/2014   Procedure: INSERTION OF MESH;  Surgeon: Atilano Ina, MD;  Location: [facility];  Service:  General;  Laterality: N/A;   TIBIA FRACTURE SURGERY     TONSILLECTOMY      Social History   Tobacco Use  Smoking Status Never  Smokeless Tobacco Never  Social History   Substance and Sexual Activity  Alcohol Use No    Family History  Problem Relation Age of Onset   Hypertension Mother    Hypertension Sister     Reviw of Systems:  Noted in current history, otherwise review of systems is negative.[name]  Physical Exam: Blood pressure 102/62, pulse 64, height 5\' 9"  (1.753 m), weight 144 lb 9.6 oz (65.6 kg), SpO2 96 %.  GEN:  Well nourished, well developed in no acute distress HEENT: Normal NECK: No JVD; No carotid bruits LYMPHATICS: No lymphadenopathy CARDIAC: RRR , no murmurs, rubs, gallops RESPIRATORY:  Clear to auscultation without rales, wheezing or rhonchi  ABDOMEN: Soft, non-tender, non-distended MUSCULOSKELETAL:  No edema; No deformity  SKIN: Warm and dry NEUROLOGIC:  Alert and oriented x 3   ECG:   Dec. 5, 2022:   NSR at 64.  1st degree AV block   Assessment / Plan:   1. Coronary artery disease-status post CABG- July, 2010  No angina ,  lipids look great   2. Transient atrial fibrillation -     no recurrent Afib   3. Hyperlipidemia -     lipids look great .  Cont meds.   4.  Hypotension:       07-13-1971, MD  08/15/2021 10:05 AM    [facility] [address] [place],  [address] [place], [place]  [place] Pager [phone] Phone: [phone]; Fax: [phone]

## 2021-08-15 ENCOUNTER — Other Ambulatory Visit: Payer: Self-pay

## 2021-08-15 ENCOUNTER — Encounter: Payer: Self-pay | Admitting: Cardiovascular Disease

## 2021-08-15 ENCOUNTER — Ambulatory Visit: Payer: Medicare PPO | Admitting: Cardiovascular Disease

## 2021-08-15 VITALS — BP 102/62 | HR 64 | Ht 69.0 in | Wt 144.6 lb

## 2021-08-15 DIAGNOSIS — E782 Mixed hyperlipidemia: Secondary | ICD-10-CM | POA: Diagnosis not present

## 2021-08-15 DIAGNOSIS — Z79899 Other long term (current) drug therapy: Secondary | ICD-10-CM

## 2021-08-15 DIAGNOSIS — I2510 Atherosclerotic heart disease of native coronary artery without angina pectoris: Secondary | ICD-10-CM

## 2021-08-15 NOTE — Patient Instructions (Signed)
Medication Instructions:  Please discontinue your Metoprolol. Continue all other medications as listed.  *If you need a refill on your cardiac medications before your next appointment, please call your pharmacy*  Lab Work: Please have blood work in 1 year (BMP, Lipids, ALT) If you have labs (blood work) drawn today and your tests are completely normal, you will receive your results only by: MyChart Message (if you have MyChart) OR A paper copy in the mail If you have any lab test that is abnormal or we need to change your treatment, we will call you to review the results.  Follow-Up: At [facility], you and your health needs are our priority.  As part of our continuing mission to provide you with exceptional heart care, we have created designated Provider Care Teams.  These Care Teams include your primary Cardiologist (physician) and Advanced Practice Providers (APPs -  Physician Assistants and Nurse Practitioners) who all work together to provide you with the care you need, when you need it.  We recommend signing up for the patient portal called "MyChart".  Sign up information is provided on this After Visit Summary.  MyChart is used to connect with patients for Virtual Visits (Telemedicine).  Patients are able to view lab/test results, encounter notes, upcoming appointments, etc.  Non-urgent messages can be sent to your provider as well.   To learn more about what you can do with MyChart, go to [email or web address].    Your next appointment:   1 year(s)  The format for your next appointment:   In Person  Provider:   Chelsea Aus, PA-C, Ronie Spies, PA-C, Nada Boozer, NP, Jacolyn Reedy, PA-C, Eligha Bridegroom, NP, or Tereso Newcomer, PA-C        Thank you for choosing [facility]!!

## 2021-09-10 ENCOUNTER — Other Ambulatory Visit: Payer: Self-pay | Admitting: Cardiovascular Disease

## 2021-12-13 ENCOUNTER — Other Ambulatory Visit: Payer: Self-pay | Admitting: Cardiovascular Disease

## 2022-02-09 ENCOUNTER — Telehealth: Payer: Self-pay | Admitting: Cardiovascular Disease

## 2022-02-09 ENCOUNTER — Other Ambulatory Visit: Payer: Self-pay | Admitting: Cardiovascular Disease

## 2022-02-09 NOTE — Telephone Encounter (Signed)
Called and spoke to patient who states that he wants a prescription for Metoprolol. Medication was discontinued at 08/15/21 OV due to hypotension. Pt states that he has been taking 1/2 tablet (12.5mg ) in the morning only and has stopped the night time dose due to blood pressure dropping. He states that he's uncomfortable stopping medication altogether and has been on for "many years" and feels like if he doesn't take the Am dose, that his HR is higher than it should be-he states it's in the 60's-70's. Assured pt that this is a safe reading, but I will route to MD to see if he would like to restart patient at half tablet daily of metoprolol tartrate.

## 2022-02-09 NOTE — Telephone Encounter (Signed)
Pt c/o medication issue:  1. Name of Medication: metoprolol    2. How are you currently taking this medication (dosage and times per day)?    3. Are you having a reaction (difficulty breathing--STAT)? no  4. What is your medication issue? Calling to see if he is able start to tart taking the medication again. He has started taking it in the morning time. Patient just would like to take it just in morning. Patient is out of medication so he would need a prescription for the medication. Please advise

## 2022-02-10 MED ORDER — METOPROLOL TARTRATE 25 MG PO TABS
12.5000 mg | ORAL_TABLET | Freq: Every morning | ORAL | 3 refills | Status: DC
Start: 2022-02-10 — End: 2023-01-29

## 2022-02-10 NOTE — Addendum Note (Signed)
Addended by: Lars Mage on: 02/10/2022 01:42 PM   Modules accepted: Orders

## 2022-02-10 NOTE — Telephone Encounter (Signed)
Nahser, Wonda Cheng, MD sent to Lariah Fleer, [place]: Unspecified (Yesterday,  1:54 PM) I'm ok with him continuing the metoprolol 12.5 mg Q AM.  Thanks  PN   Called and spoke with patient and medication sent to pharmacy on file.

## 2022-03-08 DIAGNOSIS — L247 Irritant contact dermatitis due to plants, except food: Secondary | ICD-10-CM | POA: Diagnosis not present

## 2022-03-09 DIAGNOSIS — D2272 Melanocytic nevi of left lower limb, including hip: Secondary | ICD-10-CM | POA: Diagnosis not present

## 2022-03-09 DIAGNOSIS — L720 Epidermal cyst: Secondary | ICD-10-CM | POA: Diagnosis not present

## 2022-03-09 DIAGNOSIS — L570 Actinic keratosis: Secondary | ICD-10-CM | POA: Diagnosis not present

## 2022-03-09 DIAGNOSIS — D225 Melanocytic nevi of trunk: Secondary | ICD-10-CM | POA: Diagnosis not present

## 2022-03-09 DIAGNOSIS — L821 Other seborrheic keratosis: Secondary | ICD-10-CM | POA: Diagnosis not present

## 2022-03-09 DIAGNOSIS — L309 Dermatitis, unspecified: Secondary | ICD-10-CM | POA: Diagnosis not present

## 2022-03-09 DIAGNOSIS — D1801 Hemangioma of skin and subcutaneous tissue: Secondary | ICD-10-CM | POA: Diagnosis not present

## 2022-04-07 DIAGNOSIS — K219 Gastro-esophageal reflux disease without esophagitis: Secondary | ICD-10-CM | POA: Diagnosis not present

## 2022-06-13 DIAGNOSIS — H9190 Unspecified hearing loss, unspecified ear: Secondary | ICD-10-CM | POA: Diagnosis not present

## 2022-06-13 DIAGNOSIS — R3912 Poor urinary stream: Secondary | ICD-10-CM | POA: Diagnosis not present

## 2022-06-13 DIAGNOSIS — I10 Essential (primary) hypertension: Secondary | ICD-10-CM | POA: Diagnosis not present

## 2022-06-13 DIAGNOSIS — E785 Hyperlipidemia, unspecified: Secondary | ICD-10-CM | POA: Diagnosis not present

## 2022-06-13 DIAGNOSIS — D696 Thrombocytopenia, unspecified: Secondary | ICD-10-CM | POA: Diagnosis not present

## 2022-06-13 DIAGNOSIS — Z0000 Encounter for general adult medical examination without abnormal findings: Secondary | ICD-10-CM | POA: Diagnosis not present

## 2022-09-19 ENCOUNTER — Telehealth: Payer: Self-pay | Admitting: Cardiovascular Disease

## 2022-09-19 DIAGNOSIS — I2510 Atherosclerotic heart disease of native coronary artery without angina pectoris: Secondary | ICD-10-CM

## 2022-09-19 DIAGNOSIS — E782 Mixed hyperlipidemia: Secondary | ICD-10-CM

## 2022-09-19 NOTE — Telephone Encounter (Signed)
Patient has an appt on 1/15 with Dr. Acie Fredrickson.  He would like to come in this coming Thursday to have labs done prior to his visit, this way they can go over them at his visit next week.  Patient would like lab orders to be placed for him.

## 2022-09-20 NOTE — Telephone Encounter (Signed)
Per last ov note on 08/15/21 by Nahser:  Please have blood work in 1 year (BMP, Lipids, ALT)   Will enter lab orders at this time with addition of Lipoprotein (a) since it doesn't look like he's had one done. Pt made aware and lab appt scheduled for 09/21/22.

## 2022-09-20 NOTE — Telephone Encounter (Signed)
Pt wife calling back about lab results

## 2022-09-21 ENCOUNTER — Ambulatory Visit: Payer: Medicare PPO | Attending: Cardiovascular Disease

## 2022-09-21 DIAGNOSIS — I2510 Atherosclerotic heart disease of native coronary artery without angina pectoris: Secondary | ICD-10-CM

## 2022-09-21 DIAGNOSIS — E782 Mixed hyperlipidemia: Secondary | ICD-10-CM

## 2022-09-22 LAB — LIPID PANEL
Chol/HDL Ratio: 2.5 ratio (ref 0.0–5.0)
Cholesterol, Total: 141 mg/dL (ref 100–199)
HDL: 57 mg/dL (ref >39)
LDL Chol Calc (NIH): 69 mg/dL (ref 0–99)
Triglycerides: 75 mg/dL (ref 0–149)
VLDL Cholesterol Cal: 15 mg/dL (ref 5–40)

## 2022-09-22 LAB — BASIC METABOLIC PANEL
BUN/Creatinine Ratio: 10 (ref 10–24)
BUN: 11 mg/dL (ref 8–27)
CO2: 23 mmol/L (ref 20–29)
Calcium: 9.6 mg/dL (ref 8.6–10.2)
Chloride: 101 mmol/L (ref 96–106)
Creatinine, Ser: 1.14 mg/dL (ref 0.76–1.27)
Glucose: 82 mg/dL (ref 70–99)
Potassium: 4.3 mmol/L (ref 3.5–5.2)
Sodium: 138 mmol/L (ref 134–144)
eGFR: 68 mL/min/{1.73_m2} (ref >59)

## 2022-09-22 LAB — LIPOPROTEIN A (LPA): Lipoprotein (a): 218.7 nmol/L — ABNORMAL HIGH (ref <75.0)

## 2022-09-22 LAB — ALT: ALT: 11 IU/L (ref 0–44)

## 2022-09-24 ENCOUNTER — Encounter: Payer: Self-pay | Admitting: Cardiovascular Disease

## 2022-09-24 NOTE — Progress Notes (Unsigned)
Darrell Wagner Date of Birth  01/06/1951       [facility] Office [address], [place], [place]  [address]   [place], [place]  [address] [phone]     [phone]   Fax  [phone]    Fax [phone]  Problem List: 1. 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 Transient atrial fibrillation-   3. Hyperlipidemia - lipids are well    Darrell Wagner is a 72 year old gentleman with a history of coronary artery disease-status post CABG. He also had transient atrial fibrillation. [name] done well since I last saw him. [name] not having episodes of chest pain or shortness of breath.  He has done well. [name] done very well since I last saw him. Not had any episodes of chest pain or shortness of breath.  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  He is feeling better now that it's summer.    October 10, 2012: Darrell Wagner is doing very well from a cardiac standpoint. [name] not having any episodes of angina. He denies any palpitations.  He is a Music therapist at [employer].  April 24, 2013:  Darrell Wagner continues to do well. He was admitted to the hospital for chest pain  in mid July. He had a stress Myoview study which was normal. His ejection fraction is 67%.  He has had some intermittant episodes of "gas pain " .  He has noticed improvement with protonix.    Dec. 23, 2014:  No cardiac issues.  [name] not had any cardiac issues. He is not exercising as much as he thinks he should. He is not having at that any episodes of pain doing his normal activities.  March 20, 2014:  He has occasional episodes of heartburn. He knows that  eats too late at night.  Exercising some - rides his bike occasionally.    April 15, 2015:  Darrell Wagner is doing well.   Jan. 31 2017:  Darrell Wagner is doing well Some back issues, no cardiac issues.  Exercises some .  Walks 2-3 miles a day  Labs were reviewed.   Aug. 18,  2017:  Darrell Wagner is seen today for follow-up of his coronary artery disease and coronary artery bypass grafting. Labs are good Creatinine is slightly higher   November 10, 2016:  Doing well.  No CP or dyspnea.   Lipids from Feb. 28 look great .   Nov. 6, 2018:     Followed for CAD - CABG in 2010 , , hyperlipidemia .  No CP or dyspnea.   BP is well controlled Walks and rides a bike regularly   May 09, 2018: He was seen back today for follow-up of his coronary artery disease.  He has a history of coronary artery bypass grafting in 2010.[name]  He has a history of hyperlipidemia.  Walks regularly   lipids look great.   Nov. 9, 2020   Darrell Wagner is seen today for follow up of his CAD and HLD  Walks regularly ,  Staying at home more   Had some left leg pain .   He had a normal lower extremity arterial duplex scan.  He does comment that he has some back pain and thinks that his leg pain may be originating from his back.   July 27, 2020: Darrell Wagner is seen today for follow-up of his coronary artery disease and  hyperlipidemia. He had an episode of near syncope while at his primary medical doctor's office.  The episode occurred right after he had some blood drawn.  When he called our office we reduce the dose of his metoprolol  He has been monitoring his BP   Has had some stomach issues for the past year.  His GI doctors have not been able to find any diagnosis He has lost 25-30 lbs.  Regained 10 lbs of that   Dec. 5, 2022:  Darrell Wagner is seen for follow up of his CAD , HLD  Very active out in the yard, No CP or dyspnea  Wt is 144 lbs.  Has occasional GERD symptoms .   Lipids from Dec. 1 , 2022 were reviewed  LDL looks good Up slightly from 50's to 63 recently - was eating some steak several days prior to his last blood draw   Jan. 15, 2024 Darrell Wagner is seen for follow up of his CAD , HLD   LP(A) is 219 His LDL is 69, Trigs are 75 Currently on rosuvastatin 10 mg a day  His LDL goal is < 55 I would  like to refer him to the lipid clinic for consideration for PSCK9 inhibitor . Will discuss in clinic    Current Outpatient Medications on File Prior to Visit  Medication Sig Dispense Refill   aspirin 81 MG tablet Take 81 mg by mouth at bedtime.      bifidobacterium infantis (ALIGN) capsule Take 1 capsule by mouth every morning.     cetirizine (ZYRTEC) 10 MG tablet Take 10 mg by mouth daily as needed. For allergies     esomeprazole (NEXIUM) 40 MG capsule Take 40 mg by mouth daily.     famotidine (PEPCID) 40 MG tablet Take 1 tablet by mouth daily.     fluticasone (FLONASE) 50 MCG/ACT nasal spray Place 1 spray into the nose daily as needed.     magnesium hydroxide (MILK OF MAGNESIA) 400 MG/5ML suspension Take 45 mLs by mouth daily as needed for mild constipation. (Patient not taking: Reported on 08/15/2021)     metoprolol tartrate (LOPRESSOR) 25 MG tablet Take 0.5 tablets (12.5 mg total) by mouth every morning. 45 tablet 3   niacin (NIASPAN) 1000 MG CR tablet Take 1 tablet (1,000 mg total) by mouth at bedtime. 90 tablet 2   PROCTOSOL HC 2.5 % rectal cream Apply 1 application topically as needed. (Patient not taking: Reported on 08/15/2021)     rosuvastatin (CRESTOR) 10 MG tablet TAKE 1 TABLET BY MOUTH EVERY DAY 90 tablet 3   tamsulosin (FLOMAX) 0.4 MG CAPS capsule Take 0.4 mg by mouth at bedtime.      No current facility-administered medications on file prior to visit.    Allergies  Allergen Reactions   Sulfa Drugs Cross Reactors Other (See Comments)    unknown    Past Medical History:  Diagnosis Date   Arthritis    Coronary artery disease    GERD (gastroesophageal reflux disease)    History of hiatal hernia    Hypertension    Myocardial infarct (HCC) 2010    Past Surgical History:  Procedure Laterality Date   CORONARY ARTERY BYPASS GRAFT  2010   x4   INGUINAL HERNIA REPAIR Left 07/31/2014   Procedure: LAPAROSCOPIC INGUINAL HERNIA WITH MESH;  Surgeon: Atilano Ina, MD;   Location: [facility];  Service: General;  Laterality: Left;   INSERTION OF MESH N/A 07/31/2014   Procedure: INSERTION OF MESH;  Surgeon: Gayland Curry, MD;  Location: [facility];  Service: General;  Laterality: N/A;   TIBIA FRACTURE SURGERY     TONSILLECTOMY      Social History   Tobacco Use  Smoking Status Never  Smokeless Tobacco Never    Social History   Substance and Sexual Activity  Alcohol Use No    Family History  Problem Relation Age of Onset   Hypertension Mother    Hypertension Sister     Reviw of Systems:  Noted in current history, otherwise review of systems is negative.[name]   Physical Exam: There were no vitals taken for this visit.  No BP recorded.  {Refresh Note OR Click here to enter BP  :1}***    GEN:  Well nourished, well developed in no acute distress HEENT: Normal NECK: No JVD; No carotid bruits LYMPHATICS: No lymphadenopathy CARDIAC: RRR ***, no murmurs, rubs, gallops RESPIRATORY:  Clear to auscultation without rales, wheezing or rhonchi  ABDOMEN: Soft, non-tender, non-distended MUSCULOSKELETAL:  No edema; No deformity  SKIN: Warm and dry NEUROLOGIC:  Alert and oriented x 3   ECG:      Assessment / Plan:   1. Coronary artery disease-status post CABG- July, 2010    2. Transient atrial fibrillation -        3. Hyperlipidemia -       4.  Hypotension:       Mertie Moores, MD  09/24/2022 8:45 PM    [address],  [address] [place], [place]  [address] Pager [phone] Phone: [phone]; Fax: [phone]

## 2022-09-25 ENCOUNTER — Encounter: Payer: Self-pay | Admitting: Cardiovascular Disease

## 2022-09-25 ENCOUNTER — Ambulatory Visit: Payer: Medicare PPO | Attending: Cardiovascular Disease | Admitting: Cardiovascular Disease

## 2022-09-25 VITALS — BP 124/72 | HR 65 | Ht 69.0 in | Wt 146.0 lb

## 2022-09-25 DIAGNOSIS — I2510 Atherosclerotic heart disease of native coronary artery without angina pectoris: Secondary | ICD-10-CM | POA: Diagnosis not present

## 2022-09-25 DIAGNOSIS — E782 Mixed hyperlipidemia: Secondary | ICD-10-CM

## 2022-09-25 NOTE — Patient Instructions (Signed)
Medication Instructions:  NO CHANGES *If you need a refill on your cardiac medications before your next appointment, please call your pharmacy*   Lab Work: NONE If you have labs (blood work) drawn today and your tests are completely normal, you will receive your results only by: [place] (if you have MyChart) OR A paper copy in the mail If you have any lab test that is abnormal or we need to change your treatment, we will call you to review the results.   Testing/Procedures: NONE   Follow-Up: At [facility], you and your health needs are our priority.  As part of our continuing mission to provide you with exceptional heart care, we have created designated Provider Care Teams.  These Care Teams include your primary Cardiologist (physician) and Advanced Practice Providers (APPs -  Physician Assistants and Nurse Practitioners) who all work together to provide you with the care you need, when you need it.  We recommend signing up for the patient portal called "MyChart".  Sign up information is provided on this After Visit Summary.  MyChart is used to connect with patients for Virtual Visits (Telemedicine).  Patients are able to view lab/test results, encounter notes, upcoming appointments, etc.  Non-urgent messages can be sent to your provider as well.   To learn more about what you can do with MyChart, go to [email or web address].    Your next appointment:   1 year(s)  Provider:   Mertie Moores, MD     Other Instructions REFERRAL TO LIPID CLINIC

## 2022-09-26 ENCOUNTER — Other Ambulatory Visit: Payer: Self-pay | Admitting: Cardiovascular Disease

## 2022-09-27 ENCOUNTER — Ambulatory Visit: Payer: Medicare PPO | Attending: Cardiovascular Disease | Admitting: Pharmacist

## 2022-09-27 DIAGNOSIS — E782 Mixed hyperlipidemia: Secondary | ICD-10-CM | POA: Diagnosis not present

## 2022-09-27 DIAGNOSIS — I2510 Atherosclerotic heart disease of native coronary artery without angina pectoris: Secondary | ICD-10-CM

## 2022-09-27 MED ORDER — REPATHA SURECLICK 140 MG/ML ~~LOC~~ SOAJ: 1.0000 | SUBCUTANEOUS | 3 refills | Status: DC

## 2022-09-27 NOTE — Progress Notes (Signed)
Patient ID: Darrell Wagner                 DOB: 01/04/1951                    MRN: [id number]     HPI: Darrell Wagner is a 72 y.o. male patient referred to lipid clinic by Dr Acie Fredrickson. PMH is significant for CAD s/p CABG in 2010, HLD, transient afib, and GERD. Most recently seen by Dr Acie Fredrickson 09/25/22, Lp(a) elevated at 219 and pt referred to lipid clinic to discuss PCSK9i.  Pt presents today for follow up. Has family history of premature CAD in his father. Has been on rosuvastatin and niacin for at least a decade and is tolerating well. 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 [employer] Medicare state health plan - Repatha $40/1 mo or $80/64mo. Darrell Wagner is last name on insurance plan.  Current Medications: rosuvastatin 10mg  daily, niacin 1000mg  daily Intolerances: none Risk Factors: premature ASCVD, elevated Lp(a) LDL goal: 55mg /dL  Diet: minimal meat, sometimes likes sweets   Exercise: reports staying active  Family History:  Mother and sister with HTN. Father with CAD  Social History: No tobacco, alcohol or drug use.  Labs: 09/21/22: TC 141, TG 75, HDL 57, LDL 69, Lp(a) 218.7, ALT 11 (rosuvastatin 10mg  daily)  Past Medical History:  Diagnosis Date   Arthritis    Coronary artery disease    GERD (gastroesophageal reflux disease)    History of hiatal hernia    Hypertension    Myocardial infarct [facility]) 2010    Current Outpatient Medications on File Prior to Visit  Medication Sig Dispense Refill   Alum Hydroxide-Mag Carbonate (GAVISCON PO) Take by mouth.     aspirin 81 MG tablet Take 81 mg by mouth at bedtime.      cetirizine (ZYRTEC) 10 MG tablet Take 10 mg by mouth daily as needed. For allergies     esomeprazole (NEXIUM) 40 MG capsule Take 40 mg by mouth daily.     famotidine (PEPCID) 40 MG tablet Take 1 tablet by mouth daily.     fluticasone (FLONASE) 50 MCG/ACT nasal spray Place 1 spray into the nose daily as needed.     metoprolol tartrate (LOPRESSOR) 25 MG tablet Take 0.5 tablets (12.5 mg  total) by mouth every morning. 45 tablet 3   niacin (NIASPAN) 1000 MG CR tablet Take 1 tablet (1,000 mg total) by mouth at bedtime. 90 tablet 2   rosuvastatin (CRESTOR) 10 MG tablet TAKE 1 TABLET BY MOUTH EVERY DAY 90 tablet 3   tamsulosin (FLOMAX) 0.4 MG CAPS capsule Take 0.4 mg by mouth at bedtime.      No current facility-administered medications on file prior to visit.    Allergies  Allergen Reactions   Sulfa Drugs Cross Reactors Other (See Comments)    unknown    Assessment/Plan:  1. Hyperlipidemia - LDL 69 on rosuvastatin 10mg  daily above goal < 55 given premature ASCVD. 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 Discussed increasing his statin dose vs PCSK9i. Higher rosuvastatin dose would bring his LDL to goal but does not lower Lp(a). Pt wishes to try Repatha due to ~25% Lp(a) lowering, and would certainly bring LDL to goal as well. Submitted prior authorization for Repatha, approved immediately through 09/11/23. Will recheck lipids and Lp(a) in 2 months. Also advised pt to stop niacin due to lack of CV benefit, his TG are normal.  Theone Bowell E. Olliver Boyadjian, PharmD, BCACP, [place]  [address] [place], [place] [address] Phone: [phone]; Fax: [phone] 09/27/2022 2:00 PM

## 2022-09-27 NOTE — Patient Instructions (Signed)
Your LDL cholesterol is 69 and your goal is < 55  Your lipoprotein a is elevated at 218.7. This is a genetic risk factor for heart disease  I will submit information to your insurance for Repatha and let you know when I hear back.    Repatha is a subcutaneous injection given once every 2 weeks in the fatty tissue of your stomach or upper outer thigh. Store the medication in the fridge. You can let your dose warm up to room temperature for 30 minutes before injecting if you prefer. Repatha will lower your LDL cholesterol by 60% and helps to lower your chance of having a heart attack or stroke.  Continue taking your rosuvastatin  Stop taking your niacin

## 2022-11-15 ENCOUNTER — Telehealth: Payer: Self-pay | Admitting: Cardiovascular Disease

## 2022-11-15 NOTE — Telephone Encounter (Signed)
Pt is calling back regarding this issue. He states he takes his bp at night. He says his HR was 42 with his diastolic number 0000000. This am - HR 42, diastolic 56. As stated below, he started repatha. He says he started x2 months ago, and 4th injection was Monday. He is trying to see if his problem is with the injection. He says he needs to resolve this. He needs to know if he needs to go to the ER.

## 2022-11-15 NOTE — Telephone Encounter (Signed)
Spoke to the patient what Pharm D recommendations:Recommend he skip his metoprolol dose today until HR increases back to normal . Patient stated he may discontinue Metoprolol due to current symptoms. Explained what is normal heart rate. Patient voiced understanding/

## 2022-11-15 NOTE — Telephone Encounter (Signed)
Pt wife called stating her husband received his first Repatha injection on 3/4 and since then his HR is running low. This morning HR was 50, had mild chest pain ( not sure if its ingestion). Attempt to talk to the pt, wife tried to give him the phone however, he is currently driving and hard of hearing. He did take his bp this morning however, he forgot the reading. Pt is not sure if his blood pressure machine accurate. MD and nurse not in office, will forward to Pharm D.

## 2022-11-15 NOTE — Telephone Encounter (Signed)
  Pt c/o medication issue:  1. Name of Medication: Evolocumab (REPATHA SURECLICK) [id number] MG/ML SOAJ   2. How are you currently taking this medication (dosage and times per day)? Inject 140 mg into the skin every 14 (fourteen) days.   3. Are you having a reaction (difficulty breathing--STAT)? No   4. What is your medication issue? Pt's wife calling, she said, pt took his first repatha injection last Monday 11/13/22 and pt's HR went running low around 42 this morning he took advil and HR is 52. Pt feel dizzy and feels he is going to pass out when he stand, pt is sitting right now and feeling ok. They would like to know if they need to see Dr. Acie Fredrickson or change medication

## 2022-11-15 NOTE — Telephone Encounter (Signed)
Repatha does not affect heart rate. [name] on a low dose beta blocker though. Recommend he skip his metoprolol dose today until HR increases back to normal. Would await MD input regarding further beta blocker recs, he's already on a very low dose of metoprolol (and taking tartrate formulation only once daily per med list).

## 2022-11-16 NOTE — Telephone Encounter (Signed)
Patient states he is returning RN's call and his HR is still continuing in the low 40's.

## 2022-11-16 NOTE — Telephone Encounter (Signed)
Called patient with Dr. Acie Fredrickson rec to hold metoprolol for now.  He reports that he had his walk this morning and felt fine.  Notes that his HR does respond to activity and increases some.  He checked his carotid pulse manually while I waited.  HR was 58 bpm and he noted "double beats" about every 6-7 beats.  We discussed the possibility of having PVCs and so his auto bp cuff could be misreading HRs.    In addition to noting a low HR, he has noted some chest tightness "kind of all the time" but worse since 2 days ago.  Something feels off.  Reminds him of when he had his bypass surgery 14 years ago.  Denies any changes or worsening symptoms during exercise or activity.  Pt aware I will forward to Dr. Acie Fredrickson for recommendations and will call him back.

## 2022-11-20 ENCOUNTER — Other Ambulatory Visit: Payer: Medicare PPO

## 2022-11-24 ENCOUNTER — Ambulatory Visit: Payer: Medicare PPO | Attending: Cardiovascular Disease

## 2022-11-24 DIAGNOSIS — E782 Mixed hyperlipidemia: Secondary | ICD-10-CM

## 2022-11-26 LAB — HEPATIC FUNCTION PANEL
ALT: 13 IU/L (ref 0–44)
AST: 18 IU/L (ref 0–40)
Albumin: 4.2 g/dL (ref 3.8–4.8)
Alkaline Phosphatase: 76 IU/L (ref 44–121)
Bilirubin Total: 0.5 mg/dL (ref 0.0–1.2)
Bilirubin, Direct: 0.17 mg/dL (ref 0.00–0.40)
Total Protein: 6.8 g/dL (ref 6.0–8.5)

## 2022-11-26 LAB — LIPID PANEL
Chol/HDL Ratio: 1.6 ratio (ref 0.0–5.0)
Cholesterol, Total: 89 mg/dL — ABNORMAL LOW (ref 100–199)
HDL: 57 mg/dL (ref >39)
LDL Chol Calc (NIH): 19 mg/dL (ref 0–99)
Triglycerides: 51 mg/dL (ref 0–149)
VLDL Cholesterol Cal: 13 mg/dL (ref 5–40)

## 2022-11-26 LAB — LIPOPROTEIN A (LPA): Lipoprotein (a): 193.2 nmol/L — ABNORMAL HIGH (ref <75.0)

## 2022-11-28 ENCOUNTER — Other Ambulatory Visit: Payer: Medicare PPO

## 2022-11-28 ENCOUNTER — Telehealth: Payer: Self-pay

## 2022-11-28 NOTE — Telephone Encounter (Signed)
-----   Message from Thayer Headings, MD sent at 11/26/2022  7:02 AM EDT ----- Lipid panel looks great LP(a) is elevated  LFTs are normal  Continue repatha, rosuvastatin

## 2022-11-28 NOTE — Telephone Encounter (Signed)
LMTCB as patient's voice mail on DPR had no identifying information.

## 2022-11-29 NOTE — Telephone Encounter (Signed)
Patient is returning call to discuss results. 

## 2022-11-30 NOTE — Telephone Encounter (Signed)
Pt advised his lab results and verbalized understanding.  

## 2022-12-07 DIAGNOSIS — H903 Sensorineural hearing loss, bilateral: Secondary | ICD-10-CM | POA: Diagnosis not present

## 2023-01-27 ENCOUNTER — Other Ambulatory Visit: Payer: Self-pay | Admitting: Cardiovascular Disease

## 2023-03-13 DIAGNOSIS — D044 Carcinoma in situ of skin of scalp and neck: Secondary | ICD-10-CM | POA: Diagnosis not present

## 2023-03-13 DIAGNOSIS — L565 Disseminated superficial actinic porokeratosis (DSAP): Secondary | ICD-10-CM | POA: Diagnosis not present

## 2023-03-13 DIAGNOSIS — L570 Actinic keratosis: Secondary | ICD-10-CM | POA: Diagnosis not present

## 2023-03-13 DIAGNOSIS — L814 Other melanin hyperpigmentation: Secondary | ICD-10-CM | POA: Diagnosis not present

## 2023-03-13 DIAGNOSIS — D2371 Other benign neoplasm of skin of right lower limb, including hip: Secondary | ICD-10-CM | POA: Diagnosis not present

## 2023-03-13 DIAGNOSIS — D225 Melanocytic nevi of trunk: Secondary | ICD-10-CM | POA: Diagnosis not present

## 2023-03-13 DIAGNOSIS — L821 Other seborrheic keratosis: Secondary | ICD-10-CM | POA: Diagnosis not present

## 2023-03-30 DIAGNOSIS — H43813 Vitreous degeneration, bilateral: Secondary | ICD-10-CM | POA: Diagnosis not present

## 2023-06-07 ENCOUNTER — Telehealth: Payer: Self-pay | Admitting: Cardiovascular Disease

## 2023-06-07 NOTE — Telephone Encounter (Signed)
Left the pt a message to call the office back.

## 2023-06-07 NOTE — Telephone Encounter (Signed)
Will route this request to our PharmD team to further advise and follow-up with the pt about lab orders, being they follow him for Repatha management.

## 2023-06-07 NOTE — Telephone Encounter (Signed)
Patient wants orders for lab work to be done prior to his f/u visit on 10/02/23.

## 2023-06-18 DIAGNOSIS — D696 Thrombocytopenia, unspecified: Secondary | ICD-10-CM | POA: Diagnosis not present

## 2023-06-18 DIAGNOSIS — Z0000 Encounter for general adult medical examination without abnormal findings: Secondary | ICD-10-CM | POA: Diagnosis not present

## 2023-06-18 DIAGNOSIS — E785 Hyperlipidemia, unspecified: Secondary | ICD-10-CM | POA: Diagnosis not present

## 2023-06-18 DIAGNOSIS — R3912 Poor urinary stream: Secondary | ICD-10-CM | POA: Diagnosis not present

## 2023-06-19 DIAGNOSIS — Z0000 Encounter for general adult medical examination without abnormal findings: Secondary | ICD-10-CM | POA: Diagnosis not present

## 2023-06-19 DIAGNOSIS — Z951 Presence of aortocoronary bypass graft: Secondary | ICD-10-CM | POA: Diagnosis not present

## 2023-06-19 DIAGNOSIS — R3912 Poor urinary stream: Secondary | ICD-10-CM | POA: Diagnosis not present

## 2023-06-19 DIAGNOSIS — H9190 Unspecified hearing loss, unspecified ear: Secondary | ICD-10-CM | POA: Diagnosis not present

## 2023-06-19 DIAGNOSIS — E785 Hyperlipidemia, unspecified: Secondary | ICD-10-CM | POA: Diagnosis not present

## 2023-06-19 DIAGNOSIS — I10 Essential (primary) hypertension: Secondary | ICD-10-CM | POA: Diagnosis not present

## 2023-07-03 DIAGNOSIS — Z7982 Long term (current) use of aspirin: Secondary | ICD-10-CM | POA: Diagnosis not present

## 2023-07-03 DIAGNOSIS — Z1211 Encounter for screening for malignant neoplasm of colon: Secondary | ICD-10-CM | POA: Diagnosis not present

## 2023-07-03 DIAGNOSIS — Z1212 Encounter for screening for malignant neoplasm of rectum: Secondary | ICD-10-CM | POA: Diagnosis not present

## 2023-07-03 DIAGNOSIS — D120 Benign neoplasm of cecum: Secondary | ICD-10-CM | POA: Diagnosis not present

## 2023-07-03 DIAGNOSIS — Z951 Presence of aortocoronary bypass graft: Secondary | ICD-10-CM | POA: Diagnosis not present

## 2023-07-03 DIAGNOSIS — I2510 Atherosclerotic heart disease of native coronary artery without angina pectoris: Secondary | ICD-10-CM | POA: Diagnosis not present

## 2023-07-03 DIAGNOSIS — Z79899 Other long term (current) drug therapy: Secondary | ICD-10-CM | POA: Diagnosis not present

## 2023-07-03 DIAGNOSIS — D123 Benign neoplasm of transverse colon: Secondary | ICD-10-CM | POA: Diagnosis not present

## 2023-07-03 DIAGNOSIS — I4891 Unspecified atrial fibrillation: Secondary | ICD-10-CM | POA: Diagnosis not present

## 2023-07-03 DIAGNOSIS — Z882 Allergy status to sulfonamides status: Secondary | ICD-10-CM | POA: Diagnosis not present

## 2023-07-04 ENCOUNTER — Telehealth: Payer: Self-pay | Admitting: Cardiovascular Disease

## 2023-07-04 DIAGNOSIS — I4891 Unspecified atrial fibrillation: Secondary | ICD-10-CM

## 2023-07-04 NOTE — Telephone Encounter (Signed)
Attempted to reach patient, but no answer. Has history of transient A-fib, not currently anticoagulated. Will route to [place] for recommendation on monitor vs medication. Pt scheduled to be seen in January.

## 2023-07-04 NOTE — Telephone Encounter (Signed)
Patient stated he had a colonoscopy yesterday and wants a call back to discuss  some questions about the results.

## 2023-07-05 ENCOUNTER — Ambulatory Visit: Payer: Medicare PPO | Attending: Cardiovascular Disease

## 2023-07-05 DIAGNOSIS — I4891 Unspecified atrial fibrillation: Secondary | ICD-10-CM

## 2023-07-05 MED ORDER — APIXABAN 5 MG PO TABS: 5.0000 mg | ORAL_TABLET | Freq: Two times a day (BID) | ORAL | 3 refills | Status: DC

## 2023-07-05 NOTE — Telephone Encounter (Signed)
Pt returning call, requesting cb

## 2023-07-05 NOTE — Progress Notes (Unsigned)
Enrolled patient for a 14 day Zio XT  monitor to be mailed to patients home  °

## 2023-07-05 NOTE — Telephone Encounter (Signed)
Spoke with the patient and advised on recommendations from Dr. Elease Hashimoto. Eliquis has been sent in to his pharmacy. He will discontinue ASA.  Heart monitor has been ordered and follow up appointment has been scheduled.

## 2023-07-11 DIAGNOSIS — I4891 Unspecified atrial fibrillation: Secondary | ICD-10-CM | POA: Diagnosis not present

## 2023-07-25 ENCOUNTER — Telehealth: Payer: Self-pay | Admitting: Cardiovascular Disease

## 2023-07-25 NOTE — Telephone Encounter (Signed)
Patient states his next injection he will be out of town.   He states the repatha booklet says to wait until he gets back as long as its not past 7 days. If its past 7 days just wait until his next dose.  He would like to know if that is what you recommend? Or can he take his medication with him and place it in an insulated bag to keep the temperature. He would like a call back.

## 2023-07-25 NOTE — Telephone Encounter (Signed)
Spoke with patient and he is aware the pharmD recommends to follow what is labeled. He verbalized understanding.

## 2023-07-25 NOTE — Telephone Encounter (Signed)
Yes,  we recommend what is labeled. Does not need to bring medication with him if it is inconvenient

## 2023-07-25 NOTE — Telephone Encounter (Signed)
Pt c/o medication issue:  1. Name of Medication: Evolocumab (REPATHA SURECLICK) 140 MG/ML SOAJ   2. How are you currently taking this medication (dosage and times per day)? As written  3. Are you having a reaction (difficulty breathing--STAT)? No   4. What is your medication issue? Pt is going out of town and he will be out of town when he need to get his injection. Pt would like a call back

## 2023-07-31 ENCOUNTER — Telehealth: Payer: Self-pay | Admitting: Cardiovascular Disease

## 2023-07-31 NOTE — Telephone Encounter (Signed)
Pt c/o medication issue:  1. Name of Medication: Eliquis  2. How are you currently taking this medication (dosage and times per day)?   3. Are you having a reaction (difficulty breathing--STAT)?   4. What is your medication issue? Patient says he have been having back pain- he wants to know what can he take over the counter for pain along with the Eliquis? He says he can not take Tylenol.

## 2023-08-01 NOTE — Telephone Encounter (Signed)
Would avoid NSAIDs (ibuprofen, naproxen -Aleve) due to risk of bleeding with Elqiuis. Would try something topical like Icy hot, capsicin or voltaren gel.

## 2023-08-01 NOTE — Telephone Encounter (Signed)
Called patient back with PharmD's advisement. Patient also wanted to know if his monitor results would be back for his office visit in December. Will send message to [employer] to see if they can see if monitor has been received. Patient stated he mailed it in about a week ago.

## 2023-08-02 DIAGNOSIS — M545 Low back pain, unspecified: Secondary | ICD-10-CM | POA: Diagnosis not present

## 2023-08-02 DIAGNOSIS — R1032 Left lower quadrant pain: Secondary | ICD-10-CM | POA: Diagnosis not present

## 2023-08-02 DIAGNOSIS — I499 Cardiac arrhythmia, unspecified: Secondary | ICD-10-CM | POA: Diagnosis not present

## 2023-08-05 DIAGNOSIS — I4891 Unspecified atrial fibrillation: Secondary | ICD-10-CM | POA: Diagnosis not present

## 2023-08-05 DIAGNOSIS — Z951 Presence of aortocoronary bypass graft: Secondary | ICD-10-CM | POA: Diagnosis not present

## 2023-08-05 DIAGNOSIS — I2510 Atherosclerotic heart disease of native coronary artery without angina pectoris: Secondary | ICD-10-CM | POA: Diagnosis not present

## 2023-08-05 DIAGNOSIS — K5900 Constipation, unspecified: Secondary | ICD-10-CM | POA: Diagnosis not present

## 2023-08-05 DIAGNOSIS — Z7901 Long term (current) use of anticoagulants: Secondary | ICD-10-CM | POA: Diagnosis not present

## 2023-08-09 DIAGNOSIS — I4891 Unspecified atrial fibrillation: Secondary | ICD-10-CM | POA: Diagnosis not present

## 2023-08-13 ENCOUNTER — Telehealth: Payer: Self-pay | Admitting: Cardiovascular Disease

## 2023-08-13 NOTE — Telephone Encounter (Signed)
Patient wants to know if he will need to fast and do blood work at his visit tomorrow and will this blood work replace blood work to be done at his annual visit in January.

## 2023-08-13 NOTE — Progress Notes (Unsigned)
Darrell Wagner Date of Birth  [birth year]       [facility] Office [address], [address]  [address] [place], [place]  [address]   [place], [place]  [address] [phone]     [phone]   Fax  [phone]    Fax [phone]  Problem List: 1. 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 Transient atrial fibrillation-   3. Hyperlipidemia - lipids are well    Darrell Wagner is a 72 year old gentleman with a history of coronary artery disease-status post CABG. He also had transient atrial fibrillation. [name] done well since I last saw him. [name] not having episodes of chest pain or shortness of breath.  He has done well. [name] done very well since I last saw him. Not had any episodes of chest pain or shortness of breath.  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  He is feeling better now that it's summer.    October 10, 2012: Darrell Wagner is doing very well from a cardiac standpoint. [name] not having any episodes of angina. He denies any palpitations.  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  April 24, 2013:  Darrell Wagner continues to do well. He was admitted to the hospital for chest pain  in mid July. He had a stress Myoview study which was normal. His ejection fraction is 67%.  He has had some intermittant episodes of "gas pain " .  He has noticed improvement with protonix.    Dec. 23, 2014:  No cardiac issues.  [name] not had any cardiac issues. He is not exercising as much as he thinks he should. He is not having at that any episodes of pain doing his normal activities.  March 20, 2014:  He has occasional episodes of heartburn. He knows that  eats too late at night.  Exercising some - rides his bike occasionally.    April 15, 2015:  Darrell Wagner is doing well.   Jan. 31 2017:  Darrell Wagner is doing well Some back issues, no cardiac issues.  Exercises some .  Walks 2-3 miles a day  Labs were reviewed.   Aug. 18,  2017:  Darrell Wagner is seen today for follow-up of his coronary artery disease and coronary artery bypass grafting. Labs are good Creatinine is slightly higher   November 10, 2016:  Doing well.  No CP or dyspnea.   Lipids from Feb. 28 look great .   Nov. 6, 2018:     Followed for CAD - CABG in 2010 , , hyperlipidemia .  No CP or dyspnea.   BP is well controlled Walks and rides a bike regularly   May 09, 2018: He was seen back today for follow-up of his coronary artery disease.  He has a history of coronary artery bypass grafting in 2010.[name]  He has a history of hyperlipidemia.  Walks regularly   lipids look great.   Nov. 9, 2020   Darrell Wagner is seen today for follow up of his CAD and HLD  Walks regularly ,  Staying at home more   Had some left leg pain .   He had a normal lower extremity arterial duplex scan.  He does comment that he has some back pain and thinks that his leg pain may be originating from his back.   July 27, 2020: Darrell Wagner is seen today for follow-up of his coronary artery disease and  hyperlipidemia. He had an episode of near syncope while at his primary medical doctor's office.  The episode occurred right after he had some blood drawn.  When he called our office we reduce the dose of his metoprolol  He has been monitoring his BP   Has had some stomach issues for the past year.  His GI doctors have not been able to find any diagnosis He has lost 25-30 lbs.  Regained 10 lbs of that   Dec. 5, 2022:  Darrell Wagner is seen for follow up of his CAD , HLD  Very active out in the yard, No CP or dyspnea  Wt is 144 lbs.  Has occasional GERD symptoms .   Lipids from Dec. 1 , 2022 were reviewed  LDL looks good Up slightly from 50's to 63 recently - was eating some steak several days prior to his last blood draw   Jan. 15, 2024 Darrell Wagner is seen for follow up of his CAD , HLD   LP(A) is 219 His LDL is 69, Trigs are 75 Currently on rosuvastatin 10 mg a day  His LDL goal is < 55 I would  like to refer him to the lipid clinic for consideration for PSCK9 inhibitor . Will discuss in clinic    Dec. 2, 2024  Darrell Wagner is seen for follow up of his CAD , HLD    Current Outpatient Medications on File Prior to Visit  Medication Sig Dispense Refill   Alum Hydroxide-Mag Carbonate (GAVISCON PO) Take by mouth.     apixaban (ELIQUIS) 5 MG TABS tablet Take 1 tablet (5 mg total) by mouth 2 (two) times daily. 180 tablet 3   cetirizine (ZYRTEC) 10 MG tablet Take 10 mg by mouth daily as needed. For allergies     esomeprazole (NEXIUM) 40 MG capsule Take 40 mg by mouth daily.     Evolocumab (REPATHA SURECLICK) 140 MG/ML SOAJ Inject 140 mg into the skin every 14 (fourteen) days. 6 mL 3   famotidine (PEPCID) 40 MG tablet Take 1 tablet by mouth daily.     fluticasone (FLONASE) 50 MCG/ACT nasal spray Place 1 spray into the nose daily as needed.     metoprolol tartrate (LOPRESSOR) 25 MG tablet TAKE 1/2 TABLET BY MOUTH EVERY MORNING 45 tablet 3   rosuvastatin (CRESTOR) 10 MG tablet TAKE 1 TABLET BY MOUTH EVERY DAY 90 tablet 3   tamsulosin (FLOMAX) 0.4 MG CAPS capsule Take 0.4 mg by mouth at bedtime.      No current facility-administered medications on file prior to visit.    Allergies  Allergen Reactions   Sulfa Drugs Cross Reactors Other (See Comments)    unknown    Past Medical History:  Diagnosis Date   Arthritis    Coronary artery disease    GERD (gastroesophageal reflux disease)    History of hiatal hernia    Hypertension    Myocardial infarct (HCC) 2010    Past Surgical History:  Procedure Laterality Date   CORONARY ARTERY BYPASS GRAFT  2010   x4   INGUINAL HERNIA REPAIR Left 07/31/2014   Procedure: LAPAROSCOPIC INGUINAL HERNIA WITH MESH;  Surgeon: Atilano Ina, MD;  Location: [facility];  Service: General;  Laterality: Left;   INSERTION OF MESH N/A 07/31/2014   Procedure: INSERTION OF MESH;  Surgeon: Atilano Ina, MD;  Location: [facility];  Service: General;  Laterality: N/A;    TIBIA FRACTURE SURGERY     TONSILLECTOMY      Social History  Tobacco Use  Smoking Status Never  Smokeless Tobacco Never    Social History   Substance and Sexual Activity  Alcohol Use No    Family History  Problem Relation Age of Onset   Hypertension Mother    Hypertension Sister     Reviw of Systems:  Noted in current history, otherwise review of systems is negative.[name]   Physical Exam: There were no vitals taken for this visit.  No BP recorded.  {Refresh Note OR Click here to enter BP  :1}***    GEN:  Well nourished, well developed in no acute distress HEENT: Normal NECK: No JVD; No carotid bruits LYMPHATICS: No lymphadenopathy CARDIAC: RRR ***, no murmurs, rubs, gallops RESPIRATORY:  Clear to auscultation without rales, wheezing or rhonchi  ABDOMEN: Soft, non-tender, non-distended MUSCULOSKELETAL:  No edema; No deformity  SKIN: Warm and dry NEUROLOGIC:  Alert and oriented x 3    ECG:         Assessment / Plan:   1. 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      2. Transient atrial fibrillation -        3. Hyperlipidemia -     LP(a)is elevated.  Will refer to the lipid clinic for consideration of PSCK9 inhibitor         Kristeen Miss, MD  08/13/2023 1:12 PM    [facility] [address] [place],  [address] [place], [place]  [address] Pager [phone] Phone: [phone]; Fax: [phone]

## 2023-08-14 ENCOUNTER — Encounter: Payer: Self-pay | Admitting: Cardiovascular Disease

## 2023-08-14 ENCOUNTER — Ambulatory Visit: Payer: Medicare PPO | Attending: Cardiovascular Disease | Admitting: Cardiovascular Disease

## 2023-08-14 VITALS — BP 139/65 | HR 94 | Ht 69.0 in | Wt 149.6 lb

## 2023-08-14 DIAGNOSIS — I2510 Atherosclerotic heart disease of native coronary artery without angina pectoris: Secondary | ICD-10-CM

## 2023-08-14 MED ORDER — ASPIRIN 81 MG PO TBEC: 81.0000 mg | DELAYED_RELEASE_TABLET | Freq: Every day | ORAL | Status: AC

## 2023-08-14 NOTE — Patient Instructions (Signed)
Medication Instructions:  STOP ELIQUIS START Aspirin 81mg  daily *If you need a refill on your cardiac medications before your next appointment, please call your pharmacy*  Follow-Up: At [facility], you and your health needs are our priority.  As part of our continuing mission to provide you with exceptional heart care, we have created designated Provider Care Teams.  These Care Teams include your primary Cardiologist (physician) and Advanced Practice Providers (APPs -  Physician Assistants and Nurse Practitioners) who all work together to provide you with the care you need, when you need it.  Your next appointment:   1 year(s)  Provider:   Nathaniel Man, MD

## 2023-08-22 DIAGNOSIS — R1032 Left lower quadrant pain: Secondary | ICD-10-CM | POA: Diagnosis not present

## 2023-08-23 ENCOUNTER — Other Ambulatory Visit: Payer: Self-pay | Admitting: Cardiovascular Disease

## 2023-09-26 DIAGNOSIS — K219 Gastro-esophageal reflux disease without esophagitis: Secondary | ICD-10-CM | POA: Diagnosis not present

## 2023-10-02 ENCOUNTER — Ambulatory Visit: Payer: Medicare PPO | Admitting: Cardiovascular Disease

## 2023-11-07 ENCOUNTER — Other Ambulatory Visit: Payer: Self-pay | Admitting: Cardiovascular Disease

## 2024-01-24 DIAGNOSIS — H43812 Vitreous degeneration, left eye: Secondary | ICD-10-CM | POA: Diagnosis not present

## 2024-01-24 DIAGNOSIS — H2513 Age-related nuclear cataract, bilateral: Secondary | ICD-10-CM | POA: Diagnosis not present

## 2024-01-24 DIAGNOSIS — H43813 Vitreous degeneration, bilateral: Secondary | ICD-10-CM | POA: Diagnosis not present

## 2024-02-08 ENCOUNTER — Telehealth: Payer: Self-pay | Admitting: *Deleted

## 2024-02-08 NOTE — Telephone Encounter (Signed)
 Patient walked in, stating that he had spoken to someone yesterday about the Afib on his [facility], and the person advised him to come in to the [employer] to have someone look at. Pt saw Dr. Alroy Aspen on 08/14/23, where Eliquis  was d/c and transitioned to Aspirin  81 mg q day; 14 D Heart monitor revealed no afib at that time.  Current medication list was reviewed, and everything is up to date, except the Metoprolol  Tartrate, who pt states was discontinued by Dr. Alroy Aspen. Pt has no symptoms, denies CP, SOB, palpitations, dizziness/lightheadedness. Pt was advised to send in the [facility] reports in a MyChart message or print them and bring them in; also advised him to call [phone] with other concerns/questions. Appointment with Stephenie Einstein, NP set up for 02/19/24. Plan is to see Dr. Alda Amas after Dr. Elizbeth Gum.

## 2024-02-19 ENCOUNTER — Ambulatory Visit: Admitting: Cardiology

## 2024-03-18 DIAGNOSIS — D2271 Melanocytic nevi of right lower limb, including hip: Secondary | ICD-10-CM | POA: Diagnosis not present

## 2024-03-18 DIAGNOSIS — Z85828 Personal history of other malignant neoplasm of skin: Secondary | ICD-10-CM | POA: Diagnosis not present

## 2024-03-18 DIAGNOSIS — L821 Other seborrheic keratosis: Secondary | ICD-10-CM | POA: Diagnosis not present

## 2024-03-18 DIAGNOSIS — L814 Other melanin hyperpigmentation: Secondary | ICD-10-CM | POA: Diagnosis not present

## 2024-03-18 DIAGNOSIS — D225 Melanocytic nevi of trunk: Secondary | ICD-10-CM | POA: Diagnosis not present

## 2024-03-18 DIAGNOSIS — D2272 Melanocytic nevi of left lower limb, including hip: Secondary | ICD-10-CM | POA: Diagnosis not present

## 2024-03-18 DIAGNOSIS — L570 Actinic keratosis: Secondary | ICD-10-CM | POA: Diagnosis not present

## 2024-03-18 DIAGNOSIS — D3617 Benign neoplasm of peripheral nerves and autonomic nervous system of trunk, unspecified: Secondary | ICD-10-CM | POA: Diagnosis not present

## 2024-03-18 DIAGNOSIS — L565 Disseminated superficial actinic porokeratosis (DSAP): Secondary | ICD-10-CM | POA: Diagnosis not present

## 2024-06-11 DIAGNOSIS — R140 Abdominal distension (gaseous): Secondary | ICD-10-CM | POA: Diagnosis not present

## 2024-06-11 DIAGNOSIS — R1084 Generalized abdominal pain: Secondary | ICD-10-CM | POA: Diagnosis not present

## 2024-06-11 DIAGNOSIS — R634 Abnormal weight loss: Secondary | ICD-10-CM | POA: Diagnosis not present

## 2024-06-17 DIAGNOSIS — I2510 Atherosclerotic heart disease of native coronary artery without angina pectoris: Secondary | ICD-10-CM | POA: Diagnosis not present

## 2024-06-17 DIAGNOSIS — K3189 Other diseases of stomach and duodenum: Secondary | ICD-10-CM | POA: Diagnosis not present

## 2024-06-17 DIAGNOSIS — R12 Heartburn: Secondary | ICD-10-CM | POA: Diagnosis not present

## 2024-06-17 DIAGNOSIS — K2950 Unspecified chronic gastritis without bleeding: Secondary | ICD-10-CM | POA: Diagnosis not present

## 2024-06-17 DIAGNOSIS — E785 Hyperlipidemia, unspecified: Secondary | ICD-10-CM | POA: Diagnosis not present

## 2024-06-17 DIAGNOSIS — Z79899 Other long term (current) drug therapy: Secondary | ICD-10-CM | POA: Diagnosis not present

## 2024-06-17 DIAGNOSIS — R140 Abdominal distension (gaseous): Secondary | ICD-10-CM | POA: Diagnosis not present

## 2024-06-17 DIAGNOSIS — K219 Gastro-esophageal reflux disease without esophagitis: Secondary | ICD-10-CM | POA: Diagnosis not present

## 2024-06-17 DIAGNOSIS — K5710 Diverticulosis of small intestine without perforation or abscess without bleeding: Secondary | ICD-10-CM | POA: Diagnosis not present

## 2024-06-17 DIAGNOSIS — R634 Abnormal weight loss: Secondary | ICD-10-CM | POA: Diagnosis not present

## 2024-07-08 DIAGNOSIS — I10 Essential (primary) hypertension: Secondary | ICD-10-CM | POA: Diagnosis not present

## 2024-07-08 DIAGNOSIS — Z0000 Encounter for general adult medical examination without abnormal findings: Secondary | ICD-10-CM | POA: Diagnosis not present

## 2024-07-08 DIAGNOSIS — Z951 Presence of aortocoronary bypass graft: Secondary | ICD-10-CM | POA: Diagnosis not present

## 2024-07-08 DIAGNOSIS — R634 Abnormal weight loss: Secondary | ICD-10-CM | POA: Diagnosis not present

## 2024-07-08 DIAGNOSIS — E785 Hyperlipidemia, unspecified: Secondary | ICD-10-CM | POA: Diagnosis not present

## 2024-07-08 DIAGNOSIS — H9190 Unspecified hearing loss, unspecified ear: Secondary | ICD-10-CM | POA: Diagnosis not present

## 2024-07-08 DIAGNOSIS — R3912 Poor urinary stream: Secondary | ICD-10-CM | POA: Diagnosis not present

## 2024-07-08 LAB — LAB REPORT - SCANNED: EGFR: 63

## 2024-08-01 ENCOUNTER — Encounter: Payer: Self-pay | Admitting: Cardiovascular Disease

## 2024-08-01 ENCOUNTER — Telehealth: Payer: Self-pay | Admitting: Cardiology

## 2024-08-01 NOTE — Telephone Encounter (Signed)
*  STAT* If patient is at the pharmacy, call can be transferred to refill team.   1. Which medications need to be refilled? (please list name of each medication and dose if known) Evolocumab  (REPATHA  SURECLICK) 140 MG/ML SOAJ   4. Which pharmacy/location (including street and city if local pharmacy) is medication to be sent to?  [id number] - [place], [place] - [address]     5. Do they need a 30 day or 90 day supply? 90     Sch 09/15/24

## 2024-08-01 NOTE — Telephone Encounter (Signed)
 Error

## 2024-08-04 MED ORDER — REPATHA SURECLICK 140 MG/ML ~~LOC~~ SOAJ: 1.0000 | SUBCUTANEOUS | 0 refills | Status: AC

## 2024-09-09 ENCOUNTER — Telehealth: Payer: Self-pay | Admitting: Cardiology

## 2024-09-09 NOTE — Telephone Encounter (Signed)
 Spoke with pt on phone. States he had labs drawn with Margarete for his yearly physical 03/24/2024. Informed him I will reach out to the Dr and see if he wants labs drawn before appointment or after.

## 2024-09-09 NOTE — Telephone Encounter (Signed)
 Pt wants to know if he can get blood work done before his appt. 1/5. Please advise.

## 2024-09-10 NOTE — Telephone Encounter (Signed)
 Called and made patient aware per Dr. Kate verbalized it is ok to hold off on lab since he had them done at his PCP yearly visit appointment. Writer called [facility], Dr. Kip office and asked their office to fax lab work results  for cmp and lipid panel to the Cardiology office. Patient made aware.

## 2024-09-10 NOTE — Telephone Encounter (Signed)
 We can hold off on labs if already had them checked earlier this year

## 2024-09-14 NOTE — Progress Notes (Unsigned)
 " Cardiology Office Note:    Date:  09/15/2024   ID:  Darrell Wagner, DOB 1951/06/29, MRN [id number]  PCP:  No primary care provider on file.  Cardiologist:  Lonni LITTIE Nanas, MD  Electrophysiologist:  None   Referring MD: Kip Righter, MD   Chief Complaint  Patient presents with   Coronary Artery Disease    History of Present Illness:    Darrell Wagner is a 74 y.o. male with a hx of CAD status post CABG, paroxysmal atrial fibrillation, hyperlipidemia who presents for follow-up.  Previously followed with Dr. Alveta.  Underwent CABG in 2010.  Had postoperative A-fib.  Zio patch x 14 days 08/2023 showed 55 pauses, longest lasting 3.9 seconds, frequent PACs (10%).  Since last clinic visit, he reports he is doing okay. Denies any chest pain, dyspnea, lightheadedness, syncope, lower extremity edema.  Reports occasional palpitations.   Past Medical History:  Diagnosis Date   Arthritis    Coronary artery disease    GERD (gastroesophageal reflux disease)    History of hiatal hernia    Hypertension    Myocardial infarct (HCC) 2010    Past Surgical History:  Procedure Laterality Date   CORONARY ARTERY BYPASS GRAFT  2010   x4   INGUINAL HERNIA REPAIR Left 07/31/2014   Procedure: LAPAROSCOPIC INGUINAL HERNIA WITH MESH;  Surgeon: Camellia CHRISTELLA Blush, MD;  Location: [facility];  Service: General;  Laterality: Left;   INSERTION OF MESH N/A 07/31/2014   Procedure: INSERTION OF MESH;  Surgeon: Camellia CHRISTELLA Blush, MD;  Location: [facility];  Service: General;  Laterality: N/A;   TIBIA FRACTURE SURGERY     TONSILLECTOMY      Current Medications: Active Medications[1]   Allergies:   Sulfa drugs cross reactors and Tylenol  [acetaminophen ]   Social History   Socioeconomic History   Marital status: Married    Spouse name: Not on file   Number of children: Not on file   Years of education: Not on file   Highest education level: Not on file  Occupational History   Not on file  Tobacco Use   Smoking  status: Never   Smokeless tobacco: Never  Substance and Sexual Activity   Alcohol use: No   Drug use: No   Sexual activity: Not on file  Other Topics Concern   Not on file  Social History Narrative   Not on file   Social Drivers of Health   Tobacco Use: Low Risk  (06/17/2024)   Received from [facility]   Patient History    Smoking Tobacco Use: Never    Smokeless Tobacco Use: Never    Passive Exposure: Not on file  Financial Resource Strain: Not on file  Food Insecurity: Not on file  Transportation Needs: Not on file  Physical Activity: Not on file  Stress: Not on file  Social Connections: Not on file  Depression (EYV7-0): Not on file  Alcohol Screen: Not on file  Housing: Unknown (06/09/2024)   Received from [facility]   Epic    Unable to Pay for Housing in the Last Year: Not on file    Number of Times Moved in the Last Year: Not on file    At any time in the past 12 months, were you homeless or living in a shelter (including now)?: No  Utilities: Not on file  Health Literacy: Not on file     Family History: The patient's family history includes Hypertension in his  mother and sister.  ROS:   Please see the history of present illness.     All other systems reviewed and are negative.  EKGs/Labs/Other Studies Reviewed:    The following studies were reviewed today:   EKG:   09/15/2024: Sinus rhythm, first-degree AV block, rate 67, 1 mm ST depressions in inferior leads  Recent Labs: No results found for requested labs within last 365 days.  Recent Lipid Panel    Component Value Date/Time   CHOL 89 (L) 11/24/2022 0931   TRIG 51 11/24/2022 0931   HDL 57 11/24/2022 0931   CHOLHDL 1.6 11/24/2022 0931   CHOLHDL 2.5 04/26/2016 1048   VLDL 12 04/26/2016 1048   LDLCALC 19 11/24/2022 0931    Physical Exam:    VS:  BP 122/60 (BP Location: Right Arm, Patient Position: Sitting, Cuff Size: Normal)   Pulse 67   Ht 5' 9 (1.753 m)    Wt 144 lb (65.3 kg)   SpO2 98%   BMI 21.27 kg/m     Wt Readings from Last 3 Encounters:  09/15/24 144 lb (65.3 kg)  08/14/23 149 lb 9.6 oz (67.9 kg)  09/25/22 146 lb (66.2 kg)     GEN:  Well nourished, well developed in no acute distress HEENT: Normal NECK: No JVD; No carotid bruits LYMPHATICS: No lymphadenopathy CARDIAC: RRR, 2/6 systolic murmur RESPIRATORY:  Clear to auscultation without rales, wheezing or rhonchi  ABDOMEN: Soft, non-tender, non-distended MUSCULOSKELETAL:  No edema; No deformity  SKIN: Warm and dry NEUROLOGIC:  Alert and oriented x 3 PSYCHIATRIC:  Normal affect   ASSESSMENT:    1. Coronary artery disease involving native coronary artery of native heart without angina pectoris   2. Atrial fibrillation, unspecified type (HCC)   3. Heart murmur   4. Hyperlipidemia, unspecified hyperlipidemia type   5. Sinus pause   6. Snoring    PLAN:    CAD: Underwent CABG in 2010.  Denies anginal symptoms - Continue aspirin  81 mg daily - Continue rosuvastatin  10 mg daily and Repatha  - Check echocardiogram  Murmur: 2 out of 6 systolic murmur, recommend echocardiogram  Hyperlipidemia: On Repatha  and rosuvastatin  10 mg daily.  LDL 27 on 07/08/24  Paroxysmal atrial fibrillation: Had postoperative A-fib after CABG.  No evidence of recurrence, he is not on Eliquis .  Zio patch x 14 days 08/2023 showed 55 pauses, longest lasting 3.9 seconds, frequent PACs (10%).  Pauses: 55 pauses with longest lasting 3.9 seconds on monitor 08/2023.  Pauses appear to be while sleeping, did have pauses that occurred as late as 8:45 AM but reports was probably still sleeping at this time.  Denies any lightheadedness or syncope.  Check sleep study  Snoring: Check sleep study given nocturnal pauses as above.  STOP-BANG 3  RTC in 6 months   Medication Adjustments/Labs and Tests Ordered: Current medicines are reviewed at length with the patient today.  Concerns regarding medicines are outlined  above.  Orders Placed This Encounter  Procedures   EKG 12-Lead   ECHOCARDIOGRAM COMPLETE   Itamar Sleep Study   No orders of the defined types were placed in this encounter.   Patient Instructions  Medication Instructions:  Your physician recommends that you continue on your current medications as directed. Please refer to the Current Medication list given to you today.  *If you need a refill on your cardiac medications before your next appointment, please call your pharmacy*  Lab Work: None ordered If you have labs (blood work) drawn today and  your tests are completely normal, you will receive your results only by: MyChart Message (if you have MyChart) OR A paper copy in the mail If you have any lab test that is abnormal or we need to change your treatment, we will call you to review the results.  Testing/Procedures: Echocardiogram, Itamar Sleep Study  Follow-Up: At [facility], you and your health needs are our priority.  As part of our continuing mission to provide you with exceptional heart care, our providers are all part of one team.  This team includes your primary Cardiologist (physician) and Advanced Practice Providers or APPs (Physician Assistants and Nurse Practitioners) who all work together to provide you with the care you need, when you need it.  Your next appointment:   6 month(s)  Provider:   Lonni LITTIE Nanas, MD    We recommend signing up for the patient portal called MyChart.  Sign up information is provided on this After Visit Summary.  MyChart is used to connect with patients for Virtual Visits (Telemedicine).  Patients are able to view lab/test results, encounter notes, upcoming appointments, etc.  Non-urgent messages can be sent to your provider as well.   To learn more about what you can do with MyChart, go to [email or web address].   Other Instructions Your physician has requested that you have an echocardiogram. Echocardiography is a  painless test that uses sound waves to create images of your heart. It provides your doctor with information about the size and shape of your heart and how well your hearts chambers and valves are working. This procedure takes approximately one hour. There are no restrictions for this procedure. Please do NOT wear cologne, perfume, aftershave, or lotions (deodorant is allowed). Please arrive 15 minutes prior to your appointment time.  Please note: We ask at that you not bring children with you during ultrasound (echo/ vascular) testing. Due to room size and safety concerns, children are not allowed in the ultrasound rooms during exams. Our front office staff cannot provide observation of children in our lobby area while testing is being conducted. An adult accompanying a patient to their appointment will only be allowed in the ultrasound room at the discretion of the ultrasound technician under special circumstances. We apologize for any inconvenience.   Your provider has ordered a sleep study. The company will be in touch with you and mail you a device in 6-8 weeks.         Signed, Lonni LITTIE Nanas, MD  09/15/2024 10:20 AM    [facility]     [1]  Current Meds  Medication Sig   Alum Hydroxide-Mag Carbonate (GAVISCON PO) Take by mouth.   aspirin  EC 81 MG tablet Take 1 tablet (81 mg total) by mouth daily. Swallow whole.   cetirizine (ZYRTEC) 10 MG tablet Take 10 mg by mouth daily as needed. For allergies   esomeprazole (NEXIUM) 40 MG capsule Take 40 mg by mouth daily.   Evolocumab  (REPATHA  SURECLICK) 140 MG/ML SOAJ Inject 140 mg into the skin every 14 (fourteen) days.   famotidine (PEPCID) 40 MG tablet Take 1 tablet by mouth daily.   fluticasone (FLONASE) 50 MCG/ACT nasal spray Place 1 spray into the nose daily as needed.   methocarbamol (ROBAXIN) 500 MG tablet Take 500 mg by mouth as needed for muscle spasms.   rosuvastatin  (CRESTOR ) 10 MG tablet TAKE 1 TABLET  BY MOUTH EVERY DAY   tamsulosin  (FLOMAX ) 0.4 MG CAPS capsule Take 0.4 mg by mouth  at bedtime.    "

## 2024-09-15 ENCOUNTER — Ambulatory Visit: Attending: Cardiology | Admitting: Cardiology

## 2024-09-15 VITALS — BP 122/60 | HR 67 | Ht 69.0 in | Wt 144.0 lb

## 2024-09-15 DIAGNOSIS — R011 Cardiac murmur, unspecified: Secondary | ICD-10-CM | POA: Diagnosis not present

## 2024-09-15 DIAGNOSIS — I455 Other specified heart block: Secondary | ICD-10-CM | POA: Diagnosis not present

## 2024-09-15 DIAGNOSIS — I4891 Unspecified atrial fibrillation: Secondary | ICD-10-CM | POA: Diagnosis not present

## 2024-09-15 DIAGNOSIS — R0683 Snoring: Secondary | ICD-10-CM | POA: Diagnosis not present

## 2024-09-15 DIAGNOSIS — E785 Hyperlipidemia, unspecified: Secondary | ICD-10-CM

## 2024-09-15 DIAGNOSIS — I2510 Atherosclerotic heart disease of native coronary artery without angina pectoris: Secondary | ICD-10-CM

## 2024-09-15 NOTE — Patient Instructions (Addendum)
 Medication Instructions:  Your physician recommends that you continue on your current medications as directed. Please refer to the Current Medication list given to you today.  *If you need a refill on your cardiac medications before your next appointment, please call your pharmacy*  Lab Work: None ordered If you have labs (blood work) drawn today and your tests are completely normal, you will receive your results only by: MyChart Message (if you have MyChart) OR A paper copy in the mail If you have any lab test that is abnormal or we need to change your treatment, we will call you to review the results.  Testing/Procedures: Echocardiogram, Itamar Sleep Study  Follow-Up: At [facility], you and your health needs are our priority.  As part of our continuing mission to provide you with exceptional heart care, our providers are all part of one team.  This team includes your primary Cardiologist (physician) and Advanced Practice Providers or APPs (Physician Assistants and Nurse Practitioners) who all work together to provide you with the care you need, when you need it.  Your next appointment:   6 month(s)  Provider:   Lonni LITTIE Nanas, MD    We recommend signing up for the patient portal called MyChart.  Sign up information is provided on this After Visit Summary.  MyChart is used to connect with patients for Virtual Visits (Telemedicine).  Patients are able to view lab/test results, encounter notes, upcoming appointments, etc.  Non-urgent messages can be sent to your provider as well.   To learn more about what you can do with MyChart, go to [email or web address].   Other Instructions Your physician has requested that you have an echocardiogram. Echocardiography is a painless test that uses sound waves to create images of your heart. It provides your doctor with information about the size and shape of your heart and how well your hearts chambers and valves are working.  This procedure takes approximately one hour. There are no restrictions for this procedure. Please do NOT wear cologne, perfume, aftershave, or lotions (deodorant is allowed). Please arrive 15 minutes prior to your appointment time.  Please note: We ask at that you not bring children with you during ultrasound (echo/ vascular) testing. Due to room size and safety concerns, children are not allowed in the ultrasound rooms during exams. Our front office staff cannot provide observation of children in our lobby area while testing is being conducted. An adult accompanying a patient to their appointment will only be allowed in the ultrasound room at the discretion of the ultrasound technician under special circumstances. We apologize for any inconvenience.   Your provider has ordered a sleep study. The company will be in touch with you and mail you a device in 6-8 weeks.

## 2024-10-17 ENCOUNTER — Ambulatory Visit (HOSPITAL_COMMUNITY): Admission: RE | Admit: 2024-10-17 | Source: Ambulatory Visit | Attending: Cardiology | Admitting: Cardiology

## 2024-10-17 DIAGNOSIS — R011 Cardiac murmur, unspecified: Secondary | ICD-10-CM

## 2024-10-17 LAB — ECHOCARDIOGRAM COMPLETE
AR max vel: 1.7 cm2
AV Area VTI: 1.62 cm2
AV Area mean vel: 1.57 cm2
AV Mean grad: 7.5 mmHg
AV Peak grad: 13.1 mmHg
Ao pk vel: 1.81 m/s
S' Lateral: 3 cm
